# Patient Record
Sex: Female | Born: 1975 | Race: White | Hispanic: No | Marital: Married | Smoking: Never smoker
Health system: Southern US, Community
[De-identification: ages and names within clinical notes are randomized; demographics above are authoritative.]

## PROBLEM LIST (undated history)

## (undated) DIAGNOSIS — F32A Depression, unspecified: Secondary | ICD-10-CM

## (undated) DIAGNOSIS — T7840XA Allergy, unspecified, initial encounter: Secondary | ICD-10-CM

## (undated) DIAGNOSIS — F329 Major depressive disorder, single episode, unspecified: Secondary | ICD-10-CM

## (undated) DIAGNOSIS — G4730 Sleep apnea, unspecified: Secondary | ICD-10-CM

## (undated) DIAGNOSIS — J45909 Unspecified asthma, uncomplicated: Secondary | ICD-10-CM

## (undated) HISTORY — DX: Major depressive disorder, single episode, unspecified: F32.9

## (undated) HISTORY — PX: DENTAL SURGERY: SHX609

## (undated) HISTORY — DX: Depression, unspecified: F32.A

## (undated) HISTORY — DX: Sleep apnea, unspecified: G47.30

## (undated) HISTORY — PX: TONSILLECTOMY: SUR1361

## (undated) HISTORY — DX: Allergy, unspecified, initial encounter: T78.40XA

## (undated) HISTORY — DX: Unspecified asthma, uncomplicated: J45.909

---

## 2000-10-24 HISTORY — PX: BRAIN TUMOR EXCISION: SHX577

## 2005-10-24 HISTORY — PX: ABDOMINAL HYSTERECTOMY: SHX81

## 2016-06-30 ENCOUNTER — Ambulatory Visit (INDEPENDENT_AMBULATORY_CARE_PROVIDER_SITE_OTHER): Payer: BLUE CROSS/BLUE SHIELD | Admitting: Family Medicine

## 2016-06-30 ENCOUNTER — Encounter: Payer: Self-pay | Admitting: Family Medicine

## 2016-06-30 DIAGNOSIS — Z23 Encounter for immunization: Secondary | ICD-10-CM

## 2016-06-30 DIAGNOSIS — Z7189 Other specified counseling: Secondary | ICD-10-CM

## 2016-06-30 DIAGNOSIS — Z7689 Persons encountering health services in other specified circumstances: Secondary | ICD-10-CM

## 2016-06-30 DIAGNOSIS — F640 Transsexualism: Secondary | ICD-10-CM | POA: Diagnosis not present

## 2016-06-30 DIAGNOSIS — D332 Benign neoplasm of brain, unspecified: Secondary | ICD-10-CM | POA: Insufficient documentation

## 2016-06-30 DIAGNOSIS — D330 Benign neoplasm of brain, supratentorial: Secondary | ICD-10-CM | POA: Diagnosis not present

## 2016-06-30 NOTE — Patient Instructions (Addendum)
Need old records from [facility] in [place] to eat well and stay active  Will refill medication once I have the records  Need bloodwork after resuming the medicine  Will get endocrinologist referral

## 2016-06-30 NOTE — Progress Notes (Addendum)
Chief Complaint  Patient presents with  . Annual Exam    est care    40 year old female.   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  Only medicine is testosterone. No ongoing medical problems Old records requested.    Patient Active Problem List   Diagnosis Date Noted  . Encounter to establish care with new doctor 06/30/2016  . 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   No facility-administered encounter medications on file as of 06/30/2016.     Past Medical History:  Diagnosis Date  . Allergy    hay fever  . Asthma    as a child  . Depression    old history  . Sleep apnea    not on cpap    Past Surgical History:  Procedure Laterality Date  . ABDOMINAL HYSTERECTOMY  2007  . BRAIN TUMOR EXCISION  2002   benign - frontal  . TONSILLECTOMY      Social History   Social History  . Marital status: Married    Spouse name: N/A  . Number of children: N/A  . Years of education: 57   Occupational History  . attorney    Social History Main Topics  . Smoking status: Never Smoker  . Smokeless tobacco: Never Used  . Alcohol use Yes     Comment: occasionally  . Drug use: No  . Sexual activity: Yes   Other Topics Concern  . Not on file   Social History Narrative   Born female - trans female    Family History  Problem Relation Age of Onset  . Glaucoma Mother   . Diabetes Mother     prediabetes  . Heart disease Maternal Grandfather     heart attack young  . Heart disease Paternal Grandmother     Review of Systems  Constitutional: Negative for chills, fever and weight loss.  HENT: Negative for congestion and hearing loss.   Eyes: Negative for blurred vision and pain.       Glasses  Respiratory: Negative for cough and shortness of breath.   Cardiovascular: Negative for chest pain and leg swelling.  Gastrointestinal: Negative for abdominal pain, constipation,  diarrhea and heartburn.  Genitourinary: Negative for dysuria and frequency.  Musculoskeletal: Negative for falls, joint pain and myalgias.       Arthritis one knee  Neurological: Negative for dizziness, seizures and headaches.  Endo/Heme/Allergies:       MILD LACTOSE INTOLERANCE  Psychiatric/Behavioral: Negative for depression. The patient is not nervous/anxious and does not have insomnia.     BP 98/62   Pulse 68   Resp 18   Ht 5\' 6"  (1.676 m)   Wt 224 lb (101.6 kg)   SpO2 98%   BMI 36.15 kg/m   Physical Exam  Constitutional: He is oriented to person, place, and time. He appears well-developed and well-nourished.  HENT:  Head: Normocephalic and atraumatic.  Mouth/Throat: Oropharynx is clear and moist.  Eyes: Conjunctivae are normal. Pupils are equal, round, and reactive to light.  Neurological: He is alert and oriented to person, place, and time.  Skin: Skin is warm and dry.  Psychiatric: He has a normal mood and affect. His behavior is normal. Judgment and thought content normal.  Nursing note and vitals reviewed.   1. Encounter to establish care with new doctor   2. [facility] Will get old records and refill testosterone.  Will  refer Endocrinology  3. history benign brain tumor Needs surveillance MRI   Patient Instructions  Need old records from [facility] in [place] to eat well and stay active  Will refill medication once I have the records  Need bloodwork after resuming the medicine  Will get endocrinologist referral        Raylene Everts, MD

## 2016-07-08 ENCOUNTER — Telehealth: Payer: Self-pay | Admitting: Family Medicine

## 2016-07-08 NOTE — Telephone Encounter (Signed)
Need to know where to send medical record request

## 2016-09-21 ENCOUNTER — Encounter: Payer: Self-pay | Admitting: Family Medicine

## 2016-09-27 ENCOUNTER — Ambulatory Visit: Payer: BLUE CROSS/BLUE SHIELD | Admitting: Family Medicine

## 2017-01-09 ENCOUNTER — Encounter: Payer: Self-pay | Admitting: Family Medicine

## 2017-01-10 ENCOUNTER — Other Ambulatory Visit: Payer: Self-pay | Admitting: Family Medicine

## 2017-01-10 DIAGNOSIS — Z789 Other specified health status: Secondary | ICD-10-CM

## 2017-01-10 DIAGNOSIS — F640 Transsexualism: Secondary | ICD-10-CM

## 2017-08-27 ENCOUNTER — Emergency Department (HOSPITAL_COMMUNITY)
Admission: EM | Admit: 2017-08-27 | Discharge: 2017-08-27 | Disposition: A | Discharge status: Home / Self Care | Payer: BLUE CROSS/BLUE SHIELD | Attending: Emergency Medicine | Admitting: Emergency Medicine

## 2017-08-27 ENCOUNTER — Encounter (HOSPITAL_COMMUNITY): Payer: Self-pay | Admitting: Emergency Medicine

## 2017-08-27 ENCOUNTER — Emergency Department (HOSPITAL_COMMUNITY): Payer: BLUE CROSS/BLUE SHIELD

## 2017-08-27 DIAGNOSIS — M25551 Pain in right hip: Secondary | ICD-10-CM | POA: Insufficient documentation

## 2017-08-27 NOTE — Discharge Instructions (Signed)
Your x-ray did not show a reason for why your hip is hurting.  Please use the crutches as needed.  Apply ice several times a day.  Take naproxen twice a day.  You may take acetaminophen along with that.  Continue taking your low-dose aspirin daily.  If it is not improving over the next several days, follow-up with the orthopedic physician.

## 2017-08-27 NOTE — ED Triage Notes (Signed)
RT hip pain since fall last night
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
## 2018-01-01 ENCOUNTER — Encounter: Payer: Self-pay | Admitting: Family Medicine

## 2018-08-20 DIAGNOSIS — S99822A Other specified injuries of left foot, initial encounter: Secondary | ICD-10-CM | POA: Diagnosis not present

## 2018-08-20 DIAGNOSIS — S91332A Puncture wound without foreign body, left foot, initial encounter: Secondary | ICD-10-CM | POA: Diagnosis not present

## 2019-04-13 DIAGNOSIS — M25551 Pain in right hip: Secondary | ICD-10-CM | POA: Diagnosis not present

## 2019-04-13 DIAGNOSIS — S76011A Strain of muscle, fascia and tendon of right hip, initial encounter: Secondary | ICD-10-CM | POA: Diagnosis not present

## 2019-08-07 ENCOUNTER — Other Ambulatory Visit: Payer: Self-pay

## 2019-08-07 DIAGNOSIS — Z20822 Contact with and (suspected) exposure to covid-19: Secondary | ICD-10-CM

## 2019-08-09 LAB — NOVEL CORONAVIRUS, NAA: SARS-CoV-2, NAA: NOT DETECTED

## 2019-09-25 DIAGNOSIS — K056 Periodontal disease, unspecified: Secondary | ICD-10-CM | POA: Diagnosis not present

## 2019-09-25 DIAGNOSIS — K069 Disorder of gingiva and edentulous alveolar ridge, unspecified: Secondary | ICD-10-CM | POA: Diagnosis not present

## 2019-10-03 DIAGNOSIS — K029 Dental caries, unspecified: Secondary | ICD-10-CM | POA: Diagnosis not present

## 2019-10-03 DIAGNOSIS — K056 Periodontal disease, unspecified: Secondary | ICD-10-CM | POA: Diagnosis not present

## 2019-10-03 DIAGNOSIS — K069 Disorder of gingiva and edentulous alveolar ridge, unspecified: Secondary | ICD-10-CM | POA: Diagnosis not present

## 2019-10-10 DIAGNOSIS — R110 Nausea: Secondary | ICD-10-CM | POA: Diagnosis not present

## 2019-10-10 DIAGNOSIS — J029 Acute pharyngitis, unspecified: Secondary | ICD-10-CM | POA: Diagnosis not present

## 2019-10-10 DIAGNOSIS — H9203 Otalgia, bilateral: Secondary | ICD-10-CM | POA: Diagnosis not present

## 2019-10-10 DIAGNOSIS — H6983 Other specified disorders of Eustachian tube, bilateral: Secondary | ICD-10-CM | POA: Diagnosis not present

## 2019-10-11 DIAGNOSIS — J029 Acute pharyngitis, unspecified: Secondary | ICD-10-CM | POA: Diagnosis not present

## 2019-11-11 ENCOUNTER — Other Ambulatory Visit: Payer: Self-pay

## 2019-11-11 ENCOUNTER — Ambulatory Visit
Admission: EM | Admit: 2019-11-11 | Discharge: 2019-11-11 | Disposition: A | Discharge status: Home / Self Care | Payer: BC Managed Care – PPO | Attending: Emergency Medicine | Admitting: Emergency Medicine

## 2019-11-11 DIAGNOSIS — Z90710 Acquired absence of both cervix and uterus: Secondary | ICD-10-CM | POA: Diagnosis not present

## 2019-11-11 DIAGNOSIS — N898 Other specified noninflammatory disorders of vagina: Secondary | ICD-10-CM | POA: Insufficient documentation

## 2019-11-11 DIAGNOSIS — Z880 Allergy status to penicillin: Secondary | ICD-10-CM | POA: Insufficient documentation

## 2019-11-11 DIAGNOSIS — L290 Pruritus ani: Secondary | ICD-10-CM | POA: Insufficient documentation

## 2019-11-11 DIAGNOSIS — Z90722 Acquired absence of ovaries, bilateral: Secondary | ICD-10-CM | POA: Diagnosis not present

## 2019-11-11 DIAGNOSIS — Z885 Allergy status to narcotic agent status: Secondary | ICD-10-CM | POA: Insufficient documentation

## 2019-11-11 MED ORDER — FLUCONAZOLE 200 MG PO TABS: ORAL_TABLET | ORAL | 0 refills | Status: DC

## 2019-11-11 NOTE — Discharge Instructions (Addendum)
Vaginal self-swab obtained.  We will follow up with you regarding abnormal results Declines HIV/ syphilis testing today Based on symptoms we will treat for possible yeast Prescribed diflucan 200 mg once daily and then second dose 72 hours later Take medications as prescribed and to completion If tests results are positive, please abstain from sexual activity until you and your partner(s) have been treated Follow up with PCP if symptoms persists Return here or go to ER if you have any new or worsening symptoms fever, chills, nausea, vomiting, abdominal or pelvic pain, painful intercourse, vaginal discharge, vaginal bleeding, persistent symptoms despite treatment, etc..Marland Kitchen

## 2019-11-11 NOTE — ED Provider Notes (Signed)
[facility]   [id number] 11/11/19 Arrival Time: [id number]   [id number] itching  SUBJECTIVE:  Abigail Hoffman is a 44 y.o. adult who presents with complaints of vaginal and anal itching and irritation 3 weeks ago.  Reports recent antibiotic use as well as a few episodes of loose stools. Denies changes in detergents soaps, body washes, or baths.  Patient is sexually active with 1 female partner.  Partner without symptoms.  She has tried OTC antifungal and vagisil without relief.  Denies aggravating factors.  She denies similar symptoms in the past.  She denies fever, chills, nausea, vomiting, abdominal or pelvic pain, urinary symptoms, vaginal discharge, vaginal odor, vaginal bleeding, dyspareunia, vaginal rashes or lesions.   Patient is in the process of converting from female to female.  Had a complete hysterectomy; including ovaries, uterus and cervix removed.  Was taking testosterone, currently not taking any testosterone.    No LMP recorded. Patient has had a hysterectomy.  ROS: As per HPI.  All other pertinent ROS negative.     Past Medical History:  Diagnosis Date  . Allergy    hay fever  . Asthma    as a child  . Depression    old history  . Sleep apnea    not on cpap   Past Surgical History:  Procedure Laterality Date  . ABDOMINAL HYSTERECTOMY  2007  . BRAIN TUMOR EXCISION  2002   benign - frontal  . DENTAL SURGERY    . TONSILLECTOMY     Allergies  Allergen Reactions  . Azithromycin Diarrhea  . Morphine And Related Other (See Comments)    Nightmares   . Penicillins Diarrhea   No current facility-administered medications on file prior to encounter.   No current outpatient medications on file prior to encounter.    Social History   Socioeconomic History  . Marital status: Married    Spouse name: Not on file  . Number of children: Not on file  . Years of education: 60  . Highest education level: Not on file  Occupational History  . Occupation: attorney   Tobacco Use  . Smoking status: Never Smoker  . Smokeless tobacco: Never Used  Substance and Sexual Activity  . Alcohol use: Yes    Comment: occasionally  . Drug use: No  . Sexual activity: Yes  Other Topics Concern  . 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 Difficulty of Paying Living Expenses: Not on file  Food Insecurity:   . Worried About Charity fundraiser in the Last Year: Not on file  . Ran Out of Food in the Last Year: Not on file  Transportation Needs:   . Lack of Transportation (Medical): Not on file  . Lack of Transportation (Non-Medical): Not on file  Physical Activity:   . Days of Exercise per Week: Not on file  . Minutes of Exercise per Session: Not on file  Stress:   . Feeling of Stress : Not on file  Social Connections:   . Frequency of Communication with Friends and Family: Not on file  . Frequency of Social Gatherings with Friends and Family: Not on file  . Attends Religious Services: Not on file  . Active Member of Clubs or Organizations: Not on file  . Attends Archivist Meetings: Not on file  . Marital Status: Not on file  Intimate Partner  Violence:   . Fear of Current or Ex-Partner: Not on file  . Emotionally Abused: Not on file  . Physically Abused: Not on file  . Sexually Abused: Not on file   Family History  Problem Relation Age of Onset  . Glaucoma Mother   . Diabetes Mother        prediabetes  . Heart disease Maternal Grandfather        heart attack young  . Heart disease Paternal Grandmother     OBJECTIVE:  Vitals:   11/11/19 1226  BP: 124/86  Pulse: 78  Resp: 16  Temp: 98.2 F (36.8 C)  TempSrc: Oral  SpO2: 98%     General appearance: Alert, NAD, appears stated age Head: NCAT Throat: lips, mucosa, and tongue normal; teeth and gums normal Lungs: CTA bilaterally without adventitious breath sounds Heart: regular rate and  rhythm.  Back: no CVA tenderness Abdomen: soft, non-tender; bowel sounds normal; no guarding GU: Declines; agrees to vaginal swab Skin: warm and dry Psychological:  Alert and cooperative. Normal mood and affect.  LABS:  Results for orders placed or performed in visit on 08/07/19  Novel Coronavirus, NAA ([facility])   Specimen: Nasopharyngeal(NP) swabs in vial transport medium   NASOPHARYNGE  TESTING  Result Value Ref Range   SARS-CoV-2, NAA Not Detected Not Detected    Labs Reviewed  CERVICOVAGINAL ANCILLARY ONLY    ASSESSMENT & PLAN:  1. Vaginal itching   2. Vaginal irritation   3. Anal itching     Meds ordered this encounter  Medications  . fluconazole (DIFLUCAN) 200 MG tablet    Sig: Take one dose by mouth, wait 72 hours, and then take second dose by mouth    Dispense:  2 tablet    Refill:  0    Order Specific Question:   Supervising Provider    Answer:   Raylene Everts [id number]    Pending: Labs Reviewed  CERVICOVAGINAL ANCILLARY ONLY   Vaginal self-swab obtained.  We will follow up with you regarding abnormal results Declines HIV/ syphilis testing today Based on symptoms we will treat for possible yeast Prescribed diflucan 200 mg once daily and then second dose 72 hours later Take medications as prescribed and to completion If tests results are positive, please abstain from sexual activity until you and your partner(s) have been treated Follow up with PCP if symptoms persists Return here or go to ER if you have any new or worsening symptoms fever, chills, nausea, vomiting, abdominal or pelvic pain, painful intercourse, vaginal discharge, vaginal bleeding, persistent symptoms despite treatment, etc...  Reviewed expectations re: course of current medical issues. Questions answered. Outlined signs and symptoms indicating need for more acute intervention. Patient verbalized understanding. After Visit Summary given.       Lestine Box, PA-C 11/11/19  1737

## 2019-11-11 NOTE — ED Triage Notes (Signed)
Pt presents to UC w/ c/o itchy rash in genital and anal area x3 weeks. Pt has tried vagisil and wet wipes for toileting.

## 2019-11-14 LAB — CERVICOVAGINAL ANCILLARY ONLY
Bacterial vaginitis: NEGATIVE
Candida vaginitis: POSITIVE — AB
Chlamydia: NEGATIVE
Neisseria Gonorrhea: NEGATIVE
Trichomonas: NEGATIVE

## 2019-12-09 ENCOUNTER — Other Ambulatory Visit: Payer: Self-pay

## 2019-12-09 ENCOUNTER — Ambulatory Visit
Admission: EM | Admit: 2019-12-09 | Discharge: 2019-12-09 | Disposition: A | Discharge status: Home / Self Care | Payer: BC Managed Care – PPO | Attending: Emergency Medicine | Admitting: Emergency Medicine

## 2019-12-09 DIAGNOSIS — L299 Pruritus, unspecified: Secondary | ICD-10-CM | POA: Diagnosis not present

## 2019-12-09 DIAGNOSIS — L304 Erythema intertrigo: Secondary | ICD-10-CM

## 2019-12-09 MED ORDER — CLOTRIMAZOLE 1 % EX CREA: TOPICAL_CREAM | CUTANEOUS | 0 refills | Status: DC

## 2019-12-09 NOTE — ED Provider Notes (Signed)
[facility]   [id number] 12/09/19 Arrival Time: 13  CC: Rash  SUBJECTIVE:  Abigail Hoffman is a 44 y.o. adult who presents with a itchy rash x apx 3 weeks to bilateral inner thighs.  Denies precipitating event or trauma.  Denies changes in soaps, detergents, close contacts with similar rash, environmental trigger, or allergy. Denies medications change or starting a new medication recently.  Localizes the rash to bilateral inner thighs.  Describes it as itchy.  Has tried OTC dude wipes/ vagisil wipes with minimal relief.  Symptoms are made worse with scratching.  Reports similar symptoms 3 weeks ago and dx'ed with yeast infect.  States vaginal and anal symptoms have resolved.   Denies fever, chills, nausea, vomiting, erythema, swelling, vaginal discharge, vaginal odor, vaginal itching, anal itching, SOB, chest pain, abdominal pain, changes in bowel or bladder function.    ROS: As per HPI.  All other pertinent ROS negative.     Past Medical History:  Diagnosis Date  . Allergy    hay fever  . Asthma    as a child  . Depression    old history  . Sleep apnea    not on cpap   Past Surgical History:  Procedure Laterality Date  . ABDOMINAL HYSTERECTOMY  2007  . BRAIN TUMOR EXCISION  2002   benign - frontal  . DENTAL SURGERY    . TONSILLECTOMY     Allergies  Allergen Reactions  . Azithromycin Diarrhea  . Morphine And Related Other (See Comments)    Nightmares   . Penicillins Diarrhea   No current facility-administered medications on file prior to encounter.   No current outpatient medications on file prior to encounter.   Social History   Socioeconomic History  . Marital status: Married    Spouse name: Not on file  . Number of children: Not on file  . Years of education: 85  . Highest education level: Not on file  Occupational History  . Occupation: attorney  Tobacco Use  . Smoking status: Never Smoker  . Smokeless tobacco: Never Used  Substance and Sexual  Activity  . Alcohol use: Yes    Comment: occasionally  . Drug use: No  . Sexual activity: Yes  Other Topics Concern  . 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 Difficulty of Paying Living Expenses: Not on file  Food Insecurity:   . Worried About Charity fundraiser in the Last Year: Not on file  . Ran Out of Food in the Last Year: Not on file  Transportation Needs:   . Lack of Transportation (Medical): Not on file  . Lack of Transportation (Non-Medical): Not on file  Physical Activity:   . Days of Exercise per Week: Not on file  . Minutes of Exercise per Session: Not on file  Stress:   . Feeling of Stress : Not on file  Social Connections:   . Frequency of Communication with Friends and Family: Not on file  . Frequency of Social Gatherings with Friends and Family: Not on file  . Attends Religious Services: Not on file  . Active Member of Clubs or Organizations: Not on file  . Attends Archivist Meetings: Not on file  . Marital Status: Not on file  Intimate Partner Violence:   . Fear of Current or Ex-Partner: Not on file  . Emotionally Abused: Not  on file  . Physically Abused: Not on file  . Sexually Abused: Not on file   Family History  Problem Relation Age of Onset  . Glaucoma Mother   . Diabetes Mother        prediabetes  . Heart disease Maternal Grandfather        heart attack young  . Heart disease Paternal Grandmother     OBJECTIVE: Vitals:   12/09/19 1449  BP: 116/78  Pulse: 83  Resp: 16  Temp: 98.7 F (37.1 C)  TempSrc: Oral  SpO2: 97%    General appearance: alert; no distress Head: NCAT Lungs: clear to auscultation bilaterally Heart: regular rate and rhythm.   Abdomen: soft, nondistended, normal active bowel sounds; nontender to palpation; no guarding  Extremities: no edema Skin: warm and dry; mild hyperpigmentation of groin folds, no obvious  pustules, erythema, discharge or bleeding, NTTP Psychological: alert and cooperative; normal mood and affect  ASSESSMENT & PLAN:  1. Itching   2. Pruritic intertrigo     Meds ordered this encounter  Medications  . clotrimazole (LOTRIMIN) 1 % cream    Sig: Apply to affected area 2 times daily    Dispense:  40 g    Refill:  0    Order Specific Question:   Supervising Provider    Answer:   Raylene Everts [id number]    Daily cleansing of skin folds with a mild cleanser followed by drying of affected area with a soft cloth or hair dryer on a cool setting Airing of affected area when feasible Daily application of drying powders Use of absorbent material or clothing, such as cotton or merino wool, to separate skin in folds Clotrimazole prescribed.  Use as instructed Follow up with PCP as needed Return or go to the ER if you have any new or worsening symptoms such as fever, chills, nausea, vomiting, redness, swelling, discharge, if symptoms do not improve with medications, etc...  Reviewed expectations re: course of current medical issues. Questions answered. Outlined signs and symptoms indicating need for more acute intervention. Patient verbalized understanding. After Visit Summary given.   Lestine Box, PA-C 12/09/19 1525

## 2019-12-09 NOTE — Discharge Instructions (Addendum)
Daily cleansing of skin folds with a mild cleanser followed by drying of affected area with a soft cloth or hair dryer on a cool setting Airing of affected area when feasible Daily application of drying powders Use of absorbent material or clothing, such as cotton or merino wool, to separate skin in folds Clotrimazole prescribed.  Use as instructed Follow up with PCP as needed Return or go to the ER if you have any new or worsening symptoms such as fever, chills, nausea, vomiting, redness, swelling, discharge, if symptoms do not improve with medications, etc..Marland Kitchen

## 2019-12-09 NOTE — ED Triage Notes (Signed)
Pt presents to UC w/ c/o itching on thighs in groin area x3 weeks. Pt was seen at this UC 4 weeks ago for itching on genital area. Pt states prescription helped for 1 week, then rash returned but in different area.

## 2020-01-09 ENCOUNTER — Encounter (INDEPENDENT_AMBULATORY_CARE_PROVIDER_SITE_OTHER): Payer: Self-pay | Admitting: Internal Medicine

## 2020-01-09 ENCOUNTER — Other Ambulatory Visit: Payer: Self-pay

## 2020-01-09 ENCOUNTER — Ambulatory Visit (INDEPENDENT_AMBULATORY_CARE_PROVIDER_SITE_OTHER): Payer: BC Managed Care – PPO | Admitting: Internal Medicine

## 2020-01-09 VITALS — BP 120/80 | HR 86 | Temp 97.8°F | Resp 18 | Ht 66.0 in | Wt 203.2 lb

## 2020-01-09 DIAGNOSIS — E2839 Other primary ovarian failure: Secondary | ICD-10-CM | POA: Diagnosis not present

## 2020-01-09 DIAGNOSIS — Z131 Encounter for screening for diabetes mellitus: Secondary | ICD-10-CM

## 2020-01-09 DIAGNOSIS — F640 Transsexualism: Secondary | ICD-10-CM | POA: Diagnosis not present

## 2020-01-09 DIAGNOSIS — Z13220 Encounter for screening for lipoid disorders: Secondary | ICD-10-CM

## 2020-01-09 DIAGNOSIS — E559 Vitamin D deficiency, unspecified: Secondary | ICD-10-CM | POA: Diagnosis not present

## 2020-01-09 DIAGNOSIS — R5381 Other malaise: Secondary | ICD-10-CM

## 2020-01-09 DIAGNOSIS — R5383 Other fatigue: Secondary | ICD-10-CM

## 2020-01-09 NOTE — Progress Notes (Signed)
Metrics: Intervention Frequency ACO  Documented Smoking Status Yearly  Screened one or more times in 24 months  Cessation Counseling or  Active cessation medication Past 24 months  Past 24 months   Guideline developer: UpToDate (See UpToDate for funding source) Date Released: 2014       Wellness Office Visit  Subjective:  Patient ID: Abigail Hoffman, adult    DOB: 1976/02/27  Age: 44 y.o. MRN: [id number]  CC: This 44 year old man comes to our practice as a new patient to be established. HPI  He was born female and in 2007, he underwent surgery to remove his uterus, ovaries.  He was on testosterone AndroGel but over the years has not been taking any.  He feels very fatigued, has no sex drive.  His focus and concentration is not good. He also had a benign brain tumor excision, frontal lobe in 2002. Past Medical History:  Diagnosis Date  . Allergy    hay fever  . Depression    old history  . Sleep apnea    not on cpap      Family History  Problem Relation Age of Onset  . Glaucoma Mother   . Heart disease Maternal Grandfather        heart attack young  . 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   Social History   Tobacco Use  . Smoking status: Current Every Day Smoker    Types: E-cigarettes  . Smokeless tobacco: Never Used  . Tobacco comment: Pt states he Vaps on occasion. Keeps him from bitting nails.  Substance Use Topics  . Alcohol use: Yes    Comment: occasionally    No outpatient medications have been marked as taking for the 01/09/20 encounter (Office Visit) with Doree Albee, MD.       Objective:   Today's Vitals: BP 120/80 (BP Location: Right Arm, Patient Position: Sitting, Cuff Size: Normal)   Pulse 86   Temp 97.8 F (36.6 C) (Temporal)   Resp 18   Ht 5\' 6"  (1.676 m)   Wt 203 lb 3.2 oz (92.2 kg)    SpO2 98%   BMI 32.80 kg/m  Vitals with BMI 01/09/2020 12/09/2019 11/11/2019  Height 5\' 6"  - -  Weight 203 lbs 3 oz - -  BMI [id number] - -  Systolic 123456 [id number]  Diastolic 80 78 86  Pulse 86 83 78     Physical Exam  He is obese.   Blood pressure is well controlled.       Assessment   1. Transexualism   2. Female hypogonadism   3. Malaise and fatigue   4. Vitamin D deficiency disease   5. Diabetes mellitus screening   6. Screening for lipoid disorders       Tests ordered Orders Placed This Encounter  Procedures  . CBC  . COMPLETE METABOLIC PANEL WITH GFR  . Testos,Total,Free and SHBG (Female)  . Hemoglobin A1c  . Lipid panel  . VITAMIN D 25 Hydroxy (Vit-D Deficiency, Fractures)     Plan: 1. Blood work is ordered above. 2. I explained the philosophy of our practice. 3. I will start him on testosterone cream and I have called this to [place] at 5 mg daily applied to the labia. 4. Follow-up in a few weeks to discuss all his results and further recommendations   No  orders of the defined types were placed in this encounter.   Doree Albee, MD

## 2020-01-10 ENCOUNTER — Other Ambulatory Visit (INDEPENDENT_AMBULATORY_CARE_PROVIDER_SITE_OTHER): Payer: Self-pay | Admitting: Internal Medicine

## 2020-01-10 MED ORDER — GLIPIZIDE 5 MG PO TABS: 5.0000 mg | ORAL_TABLET | Freq: Two times a day (BID) | ORAL | 3 refills | Status: DC

## 2020-01-10 NOTE — Progress Notes (Signed)
- 

## 2020-01-13 ENCOUNTER — Encounter (INDEPENDENT_AMBULATORY_CARE_PROVIDER_SITE_OTHER): Payer: Self-pay | Admitting: Internal Medicine

## 2020-01-13 ENCOUNTER — Ambulatory Visit (INDEPENDENT_AMBULATORY_CARE_PROVIDER_SITE_OTHER): Payer: BC Managed Care – PPO | Admitting: Internal Medicine

## 2020-01-13 ENCOUNTER — Other Ambulatory Visit: Payer: Self-pay

## 2020-01-13 VITALS — BP 120/79 | HR 80 | Temp 97.7°F | Ht 66.0 in | Wt 205.8 lb

## 2020-01-13 DIAGNOSIS — E1165 Type 2 diabetes mellitus with hyperglycemia: Secondary | ICD-10-CM

## 2020-01-13 LAB — CBC
HCT: 44.6 % (ref 35.0–45.0)
Hemoglobin: 15.2 g/dL (ref 11.7–15.5)
MCH: 29.4 pg (ref 27.0–33.0)
MCHC: 34.1 g/dL (ref 32.0–36.0)
MCV: 86.3 fL (ref 80.0–100.0)
MPV: 10.9 fL (ref 7.5–12.5)
Platelets: 235 10*3/uL (ref 140–400)
RBC: 5.17 10*6/uL — ABNORMAL HIGH (ref 3.80–5.10)
RDW: 11.9 % (ref 11.0–15.0)
WBC: 6.1 10*3/uL (ref 3.8–10.8)

## 2020-01-13 LAB — HEMOGLOBIN A1C
Hgb A1c MFr Bld: 14 % of total Hgb — ABNORMAL HIGH (ref <5.7)
Mean Plasma Glucose: 355 (calc)
eAG (mmol/L): 19.7 (calc)

## 2020-01-13 LAB — LIPID PANEL
Cholesterol: 195 mg/dL (ref <200)
HDL: 42 mg/dL — ABNORMAL LOW (ref >=50)
LDL Cholesterol (Calc): 106 mg/dL (calc) — ABNORMAL HIGH
Non-HDL Cholesterol (Calc): 153 mg/dL (calc) — ABNORMAL HIGH (ref <130)
Total CHOL/HDL Ratio: 4.6 (calc) (ref <5.0)
Triglycerides: 354 mg/dL — ABNORMAL HIGH (ref <150)

## 2020-01-13 LAB — TESTOS,TOTAL,FREE AND SHBG (FEMALE)
Free Testosterone: 0.6 pg/mL (ref 0.1–6.4)
Sex Hormone Binding: 36 nmol/L (ref 17–124)
Testosterone, Total, LC-MS-MS: 5 ng/dL (ref 2–45)

## 2020-01-13 LAB — COMPLETE METABOLIC PANEL WITH GFR
AG Ratio: 1.9 (calc) (ref 1.0–2.5)
ALT: 17 U/L (ref 6–29)
AST: 15 U/L (ref 10–30)
Albumin: 4.2 g/dL (ref 3.6–5.1)
Alkaline phosphatase (APISO): 101 U/L (ref 31–125)
BUN: 11 mg/dL (ref 7–25)
CO2: 25 mmol/L (ref 20–32)
Calcium: 9.4 mg/dL (ref 8.6–10.2)
Chloride: 100 mmol/L (ref 98–110)
Creat: 0.8 mg/dL (ref 0.50–1.10)
GFR, Est African American: 104 mL/min/{1.73_m2} (ref >=60)
GFR, Est Non African American: 90 mL/min/{1.73_m2} (ref >=60)
Globulin: 2.2 g/dL (calc) (ref 1.9–3.7)
Glucose, Bld: 502 mg/dL (ref 65–99)
Potassium: 4 mmol/L (ref 3.5–5.3)
Sodium: 136 mmol/L (ref 135–146)
Total Bilirubin: 0.6 mg/dL (ref 0.2–1.2)
Total Protein: 6.4 g/dL (ref 6.1–8.1)

## 2020-01-13 LAB — VITAMIN D 25 HYDROXY (VIT D DEFICIENCY, FRACTURES): Vit D, 25-Hydroxy: 9 ng/mL — ABNORMAL LOW (ref 30–100)

## 2020-01-13 MED ORDER — BLOOD GLUCOSE MONITOR KIT: PACK | 0 refills | Status: AC

## 2020-01-13 NOTE — Progress Notes (Signed)
Metrics: Intervention Frequency ACO  Documented Smoking Status Yearly  Screened one or more times in 24 months  Cessation Counseling or  Active cessation medication Past 24 months  Past 24 months   Guideline developer: UpToDate (See UpToDate for funding source) Date Released: 2014       Wellness Office Visit  Subjective:  Patient ID: Abigail Hoffman, adult    DOB: 1975/12/10  Age: 44 y.o. MRN: [id number]  CC: This man comes into further discuss his uncontrolled newly diagnosed diabetes type 2. HPI  He is hemoglobin A1c was 14% with blood glucose above 500.  He does admit that he has been having symptoms of polydipsia, polyuria and weight loss.  He generally feels unwell and tired. Had already started him on glipizide 5 mg twice a day to help get the blood sugar levels better controlled. Past Medical History:  Diagnosis Date  . Allergy    hay fever  . Depression    old history  . Sleep apnea    not on cpap      Family History  Problem Relation Age of Onset  . Glaucoma Mother   . Heart disease Maternal Grandfather        heart attack young  . 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   Social History   Tobacco Use  . Smoking status: Current Every Day Smoker    Types: E-cigarettes  . Smokeless tobacco: Never Used  . Tobacco comment: Pt states he Vaps on occasion. Keeps him from bitting nails.  Substance Use Topics  . Alcohol use: Yes    Comment: occasionally    Current Meds  Medication Sig  . glipiZIDE (GLUCOTROL) 5 MG tablet Take 1 tablet (5 mg total) by mouth 2 (two) times daily before a meal.       Objective:   Today's Vitals: BP 120/79 (BP Location: Right Arm, Patient Position: Sitting, Cuff Size: Normal)   Pulse 80   Temp 97.7 F (36.5 C) (Temporal)   Ht 5' 6"  (1.676 m)   Wt 205 lb 12.8 oz (93.4 kg)    SpO2 99%   BMI 33.22 kg/m  Vitals with BMI 01/13/2020 01/09/2020 12/09/2019  Height 5' 6"  5' 6"  -  Weight 205 lbs 13 oz 203 lbs 3 oz -  BMI 44.03 47.42 -  Systolic 595 638 756  Diastolic 79 80 78  Pulse 80 86 83     Physical Exam  He looks systemically well.  No other new physical findings today.     Assessment   1. Uncontrolled type 2 diabetes mellitus with hyperglycemia ([place])       Tests ordered No orders of the defined types were placed in this encounter.    Plan: 1. I discussed the pathophysiology of diabetes and we discussed the concept of intermittent fasting combined with a plant-based diet to help with his diabetes.  We discussed nutrition at length. 2. For the time being he will continue with glipizide twice a day and I told him he can only have the medication when he does eat.  I have encouraged him to make sure he drinks plenty of water every day at least 100 ounces. 3. I will see him at the next scheduled appointment and we will see how he is doing then. 4. I spent 45 minutes with this patient discussing nutrition, diabetes  above.   Meds ordered this encounter  Medications  . blood glucose meter kit and supplies KIT    Sig: Dispense based on patient and insurance preference. Use up to four times daily as directed. (FOR ICD-9 250.00, 250.01).    Dispense:  1 each    Refill:  0    Please provide glucometer also    Order Specific Question:   Number of strips    Answer:   100    Order Specific Question:   Number of lancets    Answer:   100    Khristian Phillippi Luther Parody, MD

## 2020-01-13 NOTE — Patient Instructions (Signed)
[facility] Dietary Recommendations for Weight Loss What to Avoid . Avoid added sugars o Often added sugar can be found in processed foods such as many condiments, dry cereals, cakes, cookies, chips, crisps, crackers, candies, sweetened drinks, etc.  o Read labels and AVOID/DECREASE use of foods with the following in their ingredient list: Sugar, fructose, high fructose corn syrup, sucrose, glucose, maltose, dextrose, molasses, cane sugar, brown sugar, any type of syrup, agave nectar, etc.   . Avoid snacking in between meals . Avoid foods made with flour o If you are going to eat food made with flour, choose those made with whole-grains; and, minimize your consumption as much as is tolerable . Avoid processed foods o These foods are generally stocked in the middle of the grocery store. Focus on shopping on the perimeter of the grocery.  . Avoid Meat  o We recommend following a plant-based diet at [facility]. Thus, we recommend avoiding meat as a general rule. Consider eating beans, legumes, eggs, and/or dairy products for regular protein sources o If you plan on eating meat limit to 4 ounces of meat at a time and choose lean options such as Fish, chicken, turkey. Avoid red meat intake such as pork and/or steak What to Include . Vegetables o GREEN LEAFY VEGETABLES: Kale, spinach, mustard greens, collard greens, cabbage, broccoli, etc. o OTHER: Asparagus, cauliflower, eggplant, carrots, peas, Brussel sprouts, tomatoes, bell peppers, zucchini, beets, cucumbers, etc. . Grains, seeds, and legumes o Beans: kidney beans, black eyed peas, garbanzo beans, black beans, pinto beans, etc. o Whole, unrefined grains: brown rice, barley, bulgur, oatmeal, etc. . Healthy fats  o Avoid highly processed fats such as vegetable oil o Examples of healthy fats: avocado, olives, virgin olive oil, dark chocolate (?72% Cocoa), nuts (peanuts, almonds, walnuts, cashews, pecans, etc.) . None to Low  Intake of Animal Sources of Protein o Meat sources: chicken, turkey, salmon, tuna. Limit to 4 ounces of meat at one time. o Consider limiting dairy sources, but when choosing dairy focus on: PLAIN Greek yogurt, cottage cheese, high-protein milk . Fruit o Choose berries  When to Eat . Intermittent Fasting: o Choosing not to eat for a specific time period, but DO FOCUS ON HYDRATION when fasting o Multiple Techniques: - Time Restricted Eating: eat 3 meals in a day, each meal lasting no more than 60 minutes, no snacks between meals - 16-18 hour fast: fast for 16 to 18 hours up to 7 days a week. Often suggested to start with 2-3 nonconsecutive days per week.  . Remember the time you sleep is counted as fasting.  . Examples of eating schedule: Fast from 7:00pm-11:00am. Eat between 11:00am-7:00pm.  - 24-hour fast: fast for 24 hours up to every other day. Often suggested to start with 1 day per week . Remember the time you sleep is counted as fasting . Examples of eating schedule:  o Eating day: eat 2-3 meals on your eating day. If doing 2 meals, each meal should last no more than 90 minutes. If doing 3 meals, each meal should last no more than 60 minutes. Finish last meal by 7:00pm. o Fasting day: Fast until 7:00pm.  o IF YOU FEEL UNWELL FOR ANY REASON/IN ANY WAY WHEN FASTING, STOP FASTING BY EATING A NUTRITIOUS SNACK OR LIGHT MEAL o ALWAYS FOCUS ON HYDRATION DURING FASTS - Acceptable Hydration sources: water, broths, tea/coffee (black tea/coffee is best but using a small amount of whole-fat dairy products in coffee/tea is acceptable).  -   Poor Hydration Sources: anything with sugar or artificial sweeteners added to it  These recommendations have been developed for patients that are actively receiving medical care from either Dr. Arrayah Connors or Sarah Gray, DNP, NP-C at [facility]. These recommendations are developed for patients with specific medical conditions and are not meant to be  distributed or used by others that are not actively receiving care from either provider listed above at [facility]. It is not appropriate to participate in the above eating plans without proper medical supervision.   Reference: Fung, J. The obesity code. [place]: [place]; 2016.   

## 2020-01-16 ENCOUNTER — Ambulatory Visit (INDEPENDENT_AMBULATORY_CARE_PROVIDER_SITE_OTHER): Payer: BC Managed Care – PPO | Admitting: Internal Medicine

## 2020-02-20 ENCOUNTER — Ambulatory Visit (INDEPENDENT_AMBULATORY_CARE_PROVIDER_SITE_OTHER): Payer: BC Managed Care – PPO | Admitting: Internal Medicine

## 2020-02-20 ENCOUNTER — Other Ambulatory Visit: Payer: Self-pay

## 2020-02-20 ENCOUNTER — Encounter (INDEPENDENT_AMBULATORY_CARE_PROVIDER_SITE_OTHER): Payer: Self-pay | Admitting: Internal Medicine

## 2020-02-20 VITALS — BP 104/64 | HR 68 | Resp 15 | Ht 66.0 in | Wt 204.4 lb

## 2020-02-20 DIAGNOSIS — Z6833 Body mass index (BMI) 33.0-33.9, adult: Secondary | ICD-10-CM | POA: Diagnosis not present

## 2020-02-20 DIAGNOSIS — E1165 Type 2 diabetes mellitus with hyperglycemia: Secondary | ICD-10-CM

## 2020-02-20 DIAGNOSIS — E6609 Other obesity due to excess calories: Secondary | ICD-10-CM | POA: Diagnosis not present

## 2020-02-20 MED ORDER — METFORMIN HCL ER 500 MG PO TB24: 500.0000 mg | ORAL_TABLET | Freq: Every day | ORAL | 3 refills | Status: DC

## 2020-02-20 NOTE — Progress Notes (Signed)
Metrics: Intervention Frequency ACO  Documented Smoking Status Yearly  Screened one or more times in 24 months  Cessation Counseling or  Active cessation medication Past 24 months  Past 24 months   Guideline developer: UpToDate (See UpToDate for funding source) Date Released: 2014       Wellness Office Visit  Subjective:  Patient ID: Abigail Hoffman, adult    DOB: 04-05-1976  Age: 44 y.o. MRN: [id number]  CC: This man comes in for follow-up of uncontrolled diabetes. HPI  He has been on glipizide twice a day and he has been trying to follow a healthier diet.  Unfortunately, he has had difficulties with intermittent fasting and that most that he has been able to do his 12 hours.  On 1 day on the weekend, he was able to do 16 hours.  Overall, his blood sugar levels have improved. Past Medical History:  Diagnosis Date  . Allergy    hay fever  . Depression    old history  . Sleep apnea    not on cpap      Family History  Problem Relation Age of Onset  . Glaucoma Mother   . Heart disease Maternal Grandfather        heart attack young  . 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   Social History   Tobacco Use  . Smoking status: Current Every Day Smoker    Types: E-cigarettes  . Smokeless tobacco: Never Used  . Tobacco comment: Pt states he Vaps on occasion. Keeps him from bitting nails.  Substance Use Topics  . Alcohol use: Yes    Comment: occasionally    Current Meds  Medication Sig  . blood glucose meter kit and supplies KIT Dispense based on patient and insurance preference. Use up to four times daily as directed. (FOR ICD-9 250.00, 250.01).  Marland Kitchen glipiZIDE (GLUCOTROL) 5 MG tablet Take 1 tablet (5 mg total) by mouth 2 (two) times daily before a meal.      Objective:   Today's Vitals: BP 104/64   Pulse 68   Resp 15    Ht 5' 6"  (1.676 m)   Wt 204 lb 6.4 oz (92.7 kg)   SpO2 97%   BMI 32.99 kg/m  Vitals with BMI 02/20/2020 01/13/2020 01/09/2020  Height 5' 6"  5' 6"  5' 6"   Weight 204 lbs 6 oz 205 lbs 13 oz 203 lbs 3 oz  BMI 33.01 41.66 06.30  Systolic 160 109 323  Diastolic 64 79 80  Pulse 68 80 86     Physical Exam  He looks systemically well but he has not lost significant weight.  He remains obese.  Blood pressure is good.     Assessment   1. Uncontrolled type 2 diabetes mellitus with hyperglycemia ([facility])   2. Class 1 obesity due to excess calories without serious comorbidity with body mass index (BMI) of 33.0 to 33.9 in adult       Tests ordered No orders of the defined types were placed in this encounter.    Plan: 1. At this point in time, I am going to try to switch him over to Metformin which will help with insulin resistance.  I want him to wean off and discontinue glipizide and I discussed in detail possible side effects of Metformin and how to deal with them.  I have given  him the extended release Metformin which I think is better tolerated. 2. I also encouraged him to try to do intermittent fasting and gave him strategies to do this including black coffee, apple cider vinegar in his water and drinking plenty of water when he feels hungry. 3. Follow-up in 2 months time and we will do the blood work at that point.   Meds ordered this encounter  Medications  . metFORMIN (GLUCOPHAGE-XR) 500 MG 24 hr tablet    Sig: Take 1 tablet (500 mg total) by mouth daily with breakfast.    Dispense:  60 tablet    Refill:  3    Boleslaus Holloway Luther Parody, MD

## 2020-04-15 ENCOUNTER — Ambulatory Visit (INDEPENDENT_AMBULATORY_CARE_PROVIDER_SITE_OTHER): Payer: BC Managed Care – PPO | Admitting: Internal Medicine

## 2020-04-15 ENCOUNTER — Other Ambulatory Visit: Payer: Self-pay

## 2020-04-15 ENCOUNTER — Encounter (INDEPENDENT_AMBULATORY_CARE_PROVIDER_SITE_OTHER): Payer: Self-pay | Admitting: Internal Medicine

## 2020-04-15 VITALS — BP 120/90 | HR 75 | Temp 97.5°F | Ht 66.0 in | Wt 200.6 lb

## 2020-04-15 DIAGNOSIS — E1165 Type 2 diabetes mellitus with hyperglycemia: Secondary | ICD-10-CM

## 2020-04-15 DIAGNOSIS — E6609 Other obesity due to excess calories: Secondary | ICD-10-CM | POA: Diagnosis not present

## 2020-04-15 DIAGNOSIS — F640 Transsexualism: Secondary | ICD-10-CM | POA: Diagnosis not present

## 2020-04-15 DIAGNOSIS — Z6833 Body mass index (BMI) 33.0-33.9, adult: Secondary | ICD-10-CM

## 2020-04-15 MED ORDER — METFORMIN HCL ER 500 MG PO TB24: 500.0000 mg | ORAL_TABLET | Freq: Two times a day (BID) | ORAL | 0 refills | Status: AC

## 2020-04-15 MED ORDER — FIRST-TESTOSTERONE MC 2 % TD CREA: 5.0000 mg | TOPICAL_CREAM | Freq: Every day | TRANSDERMAL | 0 refills | Status: AC

## 2020-04-15 NOTE — Progress Notes (Signed)
Metrics: Intervention Frequency ACO  Documented Smoking Status Yearly  Screened one or more times in 24 months  Cessation Counseling or  Active cessation medication Past 24 months  Past 24 months   Guideline developer: UpToDate (See UpToDate for funding source) Date Released: 2014       Wellness Office Visit  Subjective:  Patient ID: Abigail Hoffman, adult    DOB: 26-May-1976  Age: 44 y.o. MRN: [id number]  CC: This man comes in for follow-up of diabetes. HPI  He has been doing better with nutrition.  He has tolerated the Metformin once a day.  He is now doing intermittent fasting on almost every day 18 hours and is trying to eat healthier.  He has noticed his blood glucose levels are improving. He also needs testosterone cream which he has been on. Past Medical History:  Diagnosis Date  . Allergy    hay fever  . Depression    old history  . Sleep apnea    not on cpap   Past Surgical History:  Procedure Laterality Date  . ABDOMINAL HYSTERECTOMY  2007  . BRAIN TUMOR EXCISION  2002   benign - frontal  . DENTAL SURGERY    . TONSILLECTOMY       Family History  Problem Relation Age of Onset  . Glaucoma Mother   . Heart disease Maternal Grandfather        heart attack young  . 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   Social History   Tobacco Use  . Smoking status: Current Every Day Smoker    Types: E-cigarettes  . Smokeless tobacco: Never Used  . Tobacco comment: Pt states he Vaps on occasion. Keeps him from bitting nails.  Substance Use Topics  . Alcohol use: Yes    Comment: occasionally    Current Meds  Medication Sig  . blood glucose meter kit and supplies KIT Dispense based on patient and insurance preference. Use up to four times daily as directed. (FOR ICD-9 250.00, 250.01).  . metFORMIN (GLUCOPHAGE-XR) 500 MG  24 hr tablet Take 1 tablet (500 mg total) by mouth 2 (two) times daily.  . Testosterone Propionate (FIRST-TESTOSTERONE MC) 2 % CREA Place 5 mg onto the skin daily.  . [DISCONTINUED] metFORMIN (GLUCOPHAGE-XR) 500 MG 24 hr tablet Take 1 tablet (500 mg total) by mouth daily with breakfast.      Depression screen [facility] 2/9 02/20/2020  Decreased Interest 0  Down, Depressed, Hopeless 0  PHQ - 2 Score 0     Objective:   Today's Vitals: BP 120/90 (BP Location: Left Arm, Patient Position: Sitting, Cuff Size: Normal)   Pulse 75   Temp (!) 97.5 F (36.4 C) (Temporal)   Ht 5' 6"  (1.676 m)   Wt 200 lb 9.6 oz (91 kg)   SpO2 97%   BMI 32.38 kg/m  Vitals with BMI 04/15/2020 02/20/2020 01/13/2020  Height 5' 6"  5' 6"  5' 6"   Weight 200 lbs 10 oz 204 lbs 6 oz 205 lbs 13 oz  BMI 32.39 40.98 11.91  Systolic 478 295 621  Diastolic 90 64 79  Pulse 75 68 80     Physical Exam   He has lost 4 pounds since last visit and his clothes appear to be baggy.    Assessment   1. Uncontrolled type 2 diabetes mellitus with hyperglycemia ([place])  2. Class 1 obesity due to excess calories without serious comorbidity with body mass index (BMI) of 33.0 to 33.9 in adult   3. Transexualism       Tests ordered Orders Placed This Encounter  Procedures  . Hemoglobin A1c  . COMPLETE METABOLIC PANEL WITH GFR     Plan: 1. Blood work is ordered for his A1c for his diabetes.  I have told him to increase the Metformin to twice a day and given a new prescription for this.  He needs a hemoglobin A1c less than 8% before he can undergo orthodontic surgery. 2. I will call in testosterone cream to [facility] 5 mg apply to the labia daily. 3. Follow-up in 3 months.   Meds ordered this encounter  Medications  . metFORMIN (GLUCOPHAGE-XR) 500 MG 24 hr tablet    Sig: Take 1 tablet (500 mg total) by mouth 2 (two) times daily.    Dispense:  180 tablet    Refill:  0  . Testosterone Propionate  (FIRST-TESTOSTERONE MC) 2 % CREA    Sig: Place 5 mg onto the skin daily.    Dispense:  30 g    Refill:  0    Stevey Stapleton Luther Parody, MD

## 2020-04-16 LAB — COMPLETE METABOLIC PANEL WITH GFR
AG Ratio: 2.1 (calc) (ref 1.0–2.5)
ALT: 15 U/L (ref 6–29)
AST: 13 U/L (ref 10–35)
Albumin: 4.8 g/dL (ref 3.6–5.1)
Alkaline phosphatase (APISO): 72 U/L (ref 31–125)
BUN/Creatinine Ratio: 18 (calc) (ref 6–22)
BUN: 20 mg/dL (ref 7–25)
CO2: 26 mmol/L (ref 20–32)
Calcium: 10.5 mg/dL — ABNORMAL HIGH (ref 8.6–10.2)
Chloride: 104 mmol/L (ref 98–110)
Creat: 1.13 mg/dL — ABNORMAL HIGH (ref 0.50–1.10)
GFR, Est African American: 68 mL/min/{1.73_m2} (ref >=60)
GFR, Est Non African American: 59 mL/min/{1.73_m2} — ABNORMAL LOW (ref >=60)
Globulin: 2.3 g/dL (calc) (ref 1.9–3.7)
Glucose, Bld: 109 mg/dL — ABNORMAL HIGH (ref 65–99)
Potassium: 5.5 mmol/L — ABNORMAL HIGH (ref 3.5–5.3)
Sodium: 142 mmol/L (ref 135–146)
Total Bilirubin: 0.8 mg/dL (ref 0.2–1.2)
Total Protein: 7.1 g/dL (ref 6.1–8.1)

## 2020-04-16 LAB — HEMOGLOBIN A1C
Hgb A1c MFr Bld: 5.8 % of total Hgb — ABNORMAL HIGH (ref <5.7)
Mean Plasma Glucose: 120 (calc)
eAG (mmol/L): 6.6 (calc)

## 2020-06-18 DIAGNOSIS — K068 Other specified disorders of gingiva and edentulous alveolar ridge: Secondary | ICD-10-CM | POA: Diagnosis not present

## 2020-06-18 DIAGNOSIS — K0820 Unspecified atrophy of edentulous alveolar ridge: Secondary | ICD-10-CM | POA: Diagnosis not present

## 2020-06-18 DIAGNOSIS — K056 Periodontal disease, unspecified: Secondary | ICD-10-CM | POA: Diagnosis not present

## 2020-07-20 ENCOUNTER — Ambulatory Visit (INDEPENDENT_AMBULATORY_CARE_PROVIDER_SITE_OTHER): Payer: BC Managed Care – PPO | Admitting: Internal Medicine

## 2020-08-05 ENCOUNTER — Ambulatory Visit (INDEPENDENT_AMBULATORY_CARE_PROVIDER_SITE_OTHER): Payer: BC Managed Care – PPO | Admitting: Internal Medicine

## 2020-08-05 ENCOUNTER — Other Ambulatory Visit: Payer: Self-pay

## 2020-08-05 ENCOUNTER — Encounter (INDEPENDENT_AMBULATORY_CARE_PROVIDER_SITE_OTHER): Payer: Self-pay | Admitting: Internal Medicine

## 2020-08-05 VITALS — BP 128/70 | HR 57 | Temp 96.9°F | Resp 18 | Ht 66.0 in | Wt 186.0 lb

## 2020-08-05 DIAGNOSIS — Z13820 Encounter for screening for osteoporosis: Secondary | ICD-10-CM

## 2020-08-05 DIAGNOSIS — E669 Obesity, unspecified: Secondary | ICD-10-CM

## 2020-08-05 DIAGNOSIS — E2839 Other primary ovarian failure: Secondary | ICD-10-CM

## 2020-08-05 DIAGNOSIS — E1165 Type 2 diabetes mellitus with hyperglycemia: Secondary | ICD-10-CM | POA: Diagnosis not present

## 2020-08-05 DIAGNOSIS — F640 Transsexualism: Secondary | ICD-10-CM | POA: Diagnosis not present

## 2020-08-05 NOTE — Progress Notes (Signed)
Metrics: Intervention Frequency ACO  Documented Smoking Status Yearly  Screened one or more times in 24 months  Cessation Counseling or  Active cessation medication Past 24 months  Past 24 months   Guideline developer: UpToDate (See UpToDate for funding source) Date Released: 2014       Wellness Office Visit  Subjective:  Patient ID: Abigail Hoffman, adult    DOB: 1976/03/16  Age: 44 y.o. MRN: [id number]  CC: This man comes in for follow-up of diabetes and testosterone therapy due to transgender/transsexual state. HPI  The patient reminded me that he had a total hysterectomy with removal of both ovaries approximately 15 years ago.  Denies any specific hot flashes although possibly. He has done very well with nutrition and has managed to lose further weight.  He is actually now seriously training for cycling/running event. He continues with Metformin. He continues with testosterone cream. Past Medical History:  Diagnosis Date  . Allergy    hay fever  . Depression    old history  . Sleep apnea    not on cpap   Past Surgical History:  Procedure Laterality Date  . ABDOMINAL HYSTERECTOMY  2007  . BRAIN TUMOR EXCISION  2002   benign - frontal  . DENTAL SURGERY    . TONSILLECTOMY       Family History  Problem Relation Age of Onset  . Glaucoma Mother   . Heart disease Maternal Grandfather        heart attack young  . 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   Social History   Tobacco Use  . Smoking status: Current Every Day Smoker    Types: E-cigarettes  . Smokeless tobacco: Never Used  . Tobacco comment: Pt states he Vaps on occasion. Keeps him from bitting nails.  Substance Use Topics  . Alcohol use: Yes    Comment: occasionally    Current Meds  Medication Sig  . blood glucose meter kit and supplies KIT Dispense  based on patient and insurance preference. Use up to four times daily as directed. (FOR ICD-9 250.00, 250.01).  . metFORMIN (GLUCOPHAGE-XR) 500 MG 24 hr tablet Take 1 tablet (500 mg total) by mouth 2 (two) times daily.  . Testosterone Propionate (FIRST-TESTOSTERONE MC) 2 % CREA Place 5 mg onto the skin daily.  . Testosterone Propionate (FIRST-TESTOSTERONE MC) 2 % CREA Place 5 mg onto the skin daily.      Depression screen [facility] 2/9 02/20/2020  Decreased Interest 0  Down, Depressed, Hopeless 0  PHQ - 2 Score 0     Objective:   Today's Vitals: BP 128/70 (BP Location: Right Arm, Patient Position: Sitting, Cuff Size: Normal)   Pulse (!) 57   Temp (!) 96.9 F (36.1 C) (Temporal)   Resp 18   Ht 5' 6"  (1.676 m)   Wt 186 lb (84.4 kg)   SpO2 96%   BMI 30.02 kg/m  Vitals with BMI 08/05/2020 04/15/2020 02/20/2020  Height 5' 6"  5' 6"  5' 6"   Weight 186 lbs 200 lbs 10 oz 204 lbs 6 oz  BMI 30.04 94.70 96.28  Systolic 366 294 765  Diastolic 70 90 64  Pulse 57 75 68     Physical Exam   He looks systemically well and has lost 14 pounds since the last visit.  Blood pressure is in a very good range.  Assessment   1. Uncontrolled type 2 diabetes mellitus with hyperglycemia ([place])   2. Transexualism   3. Obesity (BMI 30-39.9)   4. Female hypogonadism   5. Osteoporosis screening       Tests ordered Orders Placed This Encounter  Procedures  . DG Bone Density  . COMPLETE METABOLIC PANEL WITH GFR  . Testos,Total,Free and SHBG (Female)  . Hemoglobin A1c  . Estradiol  . Progesterone     Plan: 1. I am very happy with the control of diabetes.  We will check an A1c. 2. Since he was born female and has had surgical menopause induced, we will also today check estradiol and progesterone levels and also screen for osteoporosis with DEXA scan. 3. Influenza vaccination was given today. 4. I did recommend that he start taking vitamin D3 10,000 units daily since his vitamin D levels were  extremely low at only 9.  I may have mentioned this before but the patient is not taking any vitamin D3 supplementation at the present time. 5. Follow-up in about 3 to 4 months time and further recommendations will depend on the results.   No orders of the defined types were placed in this encounter.   Doree Albee, MD

## 2020-08-08 LAB — COMPLETE METABOLIC PANEL WITH GFR
AG Ratio: 2.1 (calc) (ref 1.0–2.5)
ALT: 16 U/L (ref 6–29)
AST: 14 U/L (ref 10–35)
Albumin: 4.6 g/dL (ref 3.6–5.1)
Alkaline phosphatase (APISO): 81 U/L (ref 31–125)
BUN: 25 mg/dL (ref 7–25)
CO2: 25 mmol/L (ref 20–32)
Calcium: 10 mg/dL (ref 8.6–10.2)
Chloride: 106 mmol/L (ref 98–110)
Creat: 0.99 mg/dL (ref 0.50–1.10)
GFR, Est African American: 80 mL/min/{1.73_m2} (ref >=60)
GFR, Est Non African American: 69 mL/min/{1.73_m2} (ref >=60)
Globulin: 2.2 g/dL (calc) (ref 1.9–3.7)
Glucose, Bld: 89 mg/dL (ref 65–99)
Potassium: 4.3 mmol/L (ref 3.5–5.3)
Sodium: 142 mmol/L (ref 135–146)
Total Bilirubin: 0.6 mg/dL (ref 0.2–1.2)
Total Protein: 6.8 g/dL (ref 6.1–8.1)

## 2020-08-08 LAB — HEMOGLOBIN A1C
Hgb A1c MFr Bld: 5.7 % of total Hgb — ABNORMAL HIGH (ref <5.7)
Mean Plasma Glucose: 117 (calc)
eAG (mmol/L): 6.5 (calc)

## 2020-08-08 LAB — TESTOS,TOTAL,FREE AND SHBG (FEMALE)
Free Testosterone: 24.3 pg/mL — ABNORMAL HIGH (ref 0.1–6.4)
Sex Hormone Binding: 82 nmol/L (ref 17–124)
Testosterone, Total, LC-MS-MS: 293 ng/dL — ABNORMAL HIGH (ref 2–45)

## 2020-08-08 LAB — PROGESTERONE: Progesterone: <0.5 ng/mL

## 2020-08-08 LAB — ESTRADIOL: Estradiol: 19 pg/mL

## 2020-09-02 DIAGNOSIS — S73191A Other sprain of right hip, initial encounter: Secondary | ICD-10-CM | POA: Diagnosis not present

## 2020-09-25 DIAGNOSIS — S73191D Other sprain of right hip, subsequent encounter: Secondary | ICD-10-CM | POA: Diagnosis not present

## 2020-10-09 DIAGNOSIS — M25451 Effusion, right hip: Secondary | ICD-10-CM | POA: Diagnosis not present

## 2020-10-09 DIAGNOSIS — M25551 Pain in right hip: Secondary | ICD-10-CM | POA: Diagnosis not present

## 2020-10-19 DIAGNOSIS — S73191D Other sprain of right hip, subsequent encounter: Secondary | ICD-10-CM | POA: Diagnosis not present

## 2020-10-26 ENCOUNTER — Ambulatory Visit
Admission: EM | Admit: 2020-10-26 | Discharge: 2020-10-26 | Disposition: A | Discharge status: Home / Self Care | Payer: BC Managed Care – PPO | Attending: Family Medicine | Admitting: Family Medicine

## 2020-10-26 DIAGNOSIS — Z1152 Encounter for screening for COVID-19: Secondary | ICD-10-CM

## 2020-10-26 NOTE — ED Triage Notes (Signed)
Needs covid test

## 2020-10-29 LAB — NOVEL CORONAVIRUS, NAA: SARS-CoV-2, NAA: DETECTED — AB

## 2020-12-13 ENCOUNTER — Other Ambulatory Visit: Payer: Self-pay

## 2020-12-13 ENCOUNTER — Ambulatory Visit
Admission: EM | Admit: 2020-12-13 | Discharge: 2020-12-13 | Disposition: A | Discharge status: Home / Self Care | Payer: BC Managed Care – PPO | Attending: Family Medicine | Admitting: Family Medicine

## 2020-12-13 ENCOUNTER — Encounter: Payer: Self-pay | Admitting: Emergency Medicine

## 2020-12-13 DIAGNOSIS — H66002 Acute suppurative otitis media without spontaneous rupture of ear drum, left ear: Secondary | ICD-10-CM | POA: Diagnosis not present

## 2020-12-13 DIAGNOSIS — H9202 Otalgia, left ear: Secondary | ICD-10-CM | POA: Diagnosis not present

## 2020-12-13 MED ORDER — CEFDINIR 300 MG PO CAPS: 300.0000 mg | ORAL_CAPSULE | Freq: Two times a day (BID) | ORAL | 0 refills | Status: AC

## 2020-12-13 NOTE — Discharge Instructions (Signed)
I have sent in Cefdinir for you to take twice a day for 7 days  Follow up with this office or with primary care if symptoms are persisting.  Follow up in the ER for high fever, trouble swallowing, trouble breathing, other concerning symptoms.

## 2020-12-13 NOTE — ED Provider Notes (Signed)
[place]   [id number] 12/13/20 Arrival Time: 7824  CC: EAR PAIN  SUBJECTIVE: History from: patient.  Abigail Hoffman is a 45 y.o. adult who presents with of bilateral otalgia with the left worse than the right. Denies a precipitating event, such as swimming or wearing ear plugs. Patient states the pain is constant and achy in character. Patient has taken OTC medications for this without relief. Denies similar symptoms in the past. Denies fever, chills, fatigue, sinus pain, rhinorrhea, ear discharge, sore throat, SOB, wheezing, chest pain, nausea, changes in bowel or bladder habits.    ROS: As per HPI.  All other pertinent ROS negative.     Past Medical History:  Diagnosis Date  . Allergy    hay fever  . Depression    old history  . Sleep apnea    not on cpap   Past Surgical History:  Procedure Laterality Date  . ABDOMINAL HYSTERECTOMY  2007  . BRAIN TUMOR EXCISION  2002   benign - frontal  . DENTAL SURGERY    . TONSILLECTOMY     Allergies  Allergen Reactions  . Erythromycin   . Azithromycin Diarrhea  . Morphine Nausea And Vomiting    Nightmares   . Morphine And Related Other (See Comments)    Nightmares   . Penicillins Diarrhea   No current facility-administered medications on file prior to encounter.   Current Outpatient Medications on File Prior to Encounter  Medication Sig Dispense Refill  . blood glucose meter kit and supplies KIT Dispense based on patient and insurance preference. Use up to four times daily as directed. (FOR ICD-9 250.00, 250.01). 1 each 0  . metFORMIN (GLUCOPHAGE-XR) 500 MG 24 hr tablet Take 1 tablet (500 mg total) by mouth 2 (two) times daily. 180 tablet 0  . Testosterone Propionate (FIRST-TESTOSTERONE MC) 2 % CREA Place 5 mg onto the skin daily.    . Testosterone Propionate (FIRST-TESTOSTERONE MC) 2 % CREA Place 5 mg onto the skin daily. 30 g 0   Social History   Socioeconomic History  . Marital status: Married    Spouse name:  Not on file  . Number of children: Not on file  . Years of education: 36  . Highest education level: Not on file  Occupational History  . Occupation: attorney  Tobacco Use  . Smoking status: Current Every Day Smoker    Types: E-cigarettes  . Smokeless tobacco: Never Used  . Tobacco comment: Pt states he Vaps on occasion. Keeps him from bitting nails.  Substance and Sexual Activity  . Alcohol use: Yes    Comment: occasionally  . Drug use: No  . Sexual activity: Yes  Other Topics Concern  . 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   Social Determinants of Health   Financial Resource Strain: Not on file  Food Insecurity: Not on file  Transportation Needs: Not on file  Physical Activity: Not on file  Stress: Not on file  Social Connections: Not on file  Intimate Partner Violence: Not on file   Family History  Problem Relation Age of Onset  . Glaucoma Mother   . Heart disease Maternal Grandfather        heart attack young  . Heart disease Paternal Grandmother     OBJECTIVE:  Vitals:   12/13/20 1027  BP: 111/76  Pulse: 65  Resp: 18  Temp: 98.8 F (37.1 C)  TempSrc: Oral  SpO2: 98%     General appearance: alert; appears fatigued HEENT: Ears: EACs clear, bilateral TMs erythematous, bulging, with effusion; Eyes: PERRL, EOMI grossly; Sinuses nontender to palpation; Nose: clear rhinorrhea; Throat: oropharynx mildly erythematous, tonsils 1+ without white tonsillar exudates, uvula midline Neck: supple without LAD Lungs: unlabored respirations, symmetrical air entry; cough: absent; no respiratory distress Heart: regular rate and rhythm.  Radial pulses 2+ symmetrical bilaterally Skin: warm and dry Psychological: alert and cooperative; normal mood and affect  Imaging: No results found.   ASSESSMENT & PLAN:  1. Non-recurrent acute suppurative otitis media of left  ear without spontaneous rupture of tympanic membrane   2. Acute otalgia, left     Meds ordered this encounter  Medications  . cefdinir (OMNICEF) 300 MG capsule    Sig: Take 1 capsule (300 mg total) by mouth 2 (two) times daily for 7 days.    Dispense:  14 capsule    Refill:  0    Order Specific Question:   Supervising Provider    Answer:   Chase Picket [[id number]]    Rest and drink plenty of fluids Prescribed Cefdinir BID x 7 days Take medications as directed and to completion Continue to use OTC ibuprofen and/ or tylenol as needed for pain control Follow up with PCP if symptoms persists Return here or go to the ER if you have any new or worsening symptoms   Reviewed expectations re: course of current medical issues. Questions answered. Outlined signs and symptoms indicating need for more acute intervention. Patient verbalized understanding. After Visit Summary given.         Faustino Congress, NP 12/13/20 1512

## 2020-12-13 NOTE — ED Triage Notes (Signed)
Left ear pain for the past couple of days.  Hard to hear out of that ear.

## 2020-12-14 ENCOUNTER — Other Ambulatory Visit: Payer: Self-pay

## 2020-12-14 ENCOUNTER — Encounter (INDEPENDENT_AMBULATORY_CARE_PROVIDER_SITE_OTHER): Payer: Self-pay | Admitting: Internal Medicine

## 2020-12-14 ENCOUNTER — Ambulatory Visit (INDEPENDENT_AMBULATORY_CARE_PROVIDER_SITE_OTHER): Payer: BC Managed Care – PPO | Admitting: Internal Medicine

## 2020-12-14 VITALS — BP 124/76 | HR 71 | Temp 96.8°F | Ht 66.0 in | Wt 189.0 lb

## 2020-12-14 DIAGNOSIS — E2839 Other primary ovarian failure: Secondary | ICD-10-CM

## 2020-12-14 DIAGNOSIS — F640 Transsexualism: Secondary | ICD-10-CM

## 2020-12-14 DIAGNOSIS — E559 Vitamin D deficiency, unspecified: Secondary | ICD-10-CM

## 2020-12-14 DIAGNOSIS — E669 Obesity, unspecified: Secondary | ICD-10-CM | POA: Diagnosis not present

## 2020-12-14 DIAGNOSIS — E1165 Type 2 diabetes mellitus with hyperglycemia: Secondary | ICD-10-CM | POA: Diagnosis not present

## 2020-12-14 MED ORDER — PROGESTERONE 200 MG PO CAPS: 200.0000 mg | ORAL_CAPSULE | Freq: Every day | ORAL | 3 refills | Status: AC

## 2020-12-14 NOTE — Progress Notes (Signed)
Metrics: Intervention Frequency ACO  Documented Smoking Status Yearly  Screened one or more times in 24 months  Cessation Counseling or  Active cessation medication Past 24 months  Past 24 months   Guideline developer: UpToDate (See UpToDate for funding source) Date Released: 2014       Wellness Office Visit  Subjective:  Patient ID: Abigail Hoffman, adult    DOB: 25-Apr-1976  Age: 45 y.o. MRN: [id number]  CC: This man comes in for follow-up of diabetes, transsexualism, obesity, vitamin D deficiency. HPI  Since last time I saw him, he has not been as consistent with nutrition and his blood glucose levels have been elevated.  He also has been trying to cut down the dose of Metformin to 250 mg daily which might somewhat account for the elevation in blood sugar levels.  His last hemoglobin A1c was only 5.7%. We did blood work on the last visit which showed healthy testosterone levels but as expected nonexistent progesterone and very low estradiol levels as he has had both his ovaries removed. Past Medical History:  Diagnosis Date  . Allergy    hay fever  . Depression    old history  . Sleep apnea    not on cpap   Past Surgical History:  Procedure Laterality Date  . ABDOMINAL HYSTERECTOMY  2007  . BRAIN TUMOR EXCISION  2002   benign - frontal  . DENTAL SURGERY    . TONSILLECTOMY       Family History  Problem Relation Age of Onset  . Glaucoma Mother   . Heart disease Maternal Grandfather        heart attack young  . 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   Social History   Tobacco Use  . Smoking status: Current Every Day Smoker    Types: E-cigarettes  . Smokeless tobacco: Never Used  . Tobacco comment: Pt states he Vaps on occasion. Keeps him from bitting nails.  Substance Use Topics  . Alcohol use: Yes    Comment:  occasionally    Current Meds  Medication Sig  . blood glucose meter kit and supplies KIT Dispense based on patient and insurance preference. Use up to four times daily as directed. (FOR ICD-9 250.00, 250.01).  . cefdinir (OMNICEF) 300 MG capsule Take 1 capsule (300 mg total) by mouth 2 (two) times daily for 7 days.  . metFORMIN (GLUCOPHAGE-XR) 500 MG 24 hr tablet Take 1 tablet (500 mg total) by mouth 2 (two) times daily. (Patient taking differently: Take 250 mg by mouth daily.)  . progesterone (PROMETRIUM) 200 MG capsule Take 1 capsule (200 mg total) by mouth daily.  . Testosterone Propionate (FIRST-TESTOSTERONE MC) 2 % CREA Place 5 mg onto the skin daily.  . Testosterone Propionate (FIRST-TESTOSTERONE MC) 2 % CREA Place 5 mg onto the skin daily.     [place] Office Visit from 12/14/2020 in [facility]  PHQ-9 Total Score 0      Objective:   Today's Vitals: BP 124/76   Pulse 71   Temp (!) 96.8 F (36 C) (Temporal)   Ht 5' 6"  (1.676 m)   Wt 189 lb (85.7 kg)   SpO2 99%   BMI 30.51 kg/m  Vitals with BMI 12/14/2020 12/13/2020 10/26/2020  Height 5' 6"  - -  Weight 189 lbs - -  BMI 30.52 - -  Systolic 938 182 993  Diastolic 76 76 76  Pulse 71 65 86     Physical Exam   He looks systemically well, remains obese.  He has gained about 3 pounds since last time I saw him.  Blood pressure is excellent.    Assessment   1. Uncontrolled type 2 diabetes mellitus with hyperglycemia ([place])   2. Transexualism   3. Obesity (BMI 30-39.9)   4. Female hypogonadism   5. Vitamin D deficiency disease       Tests ordered No orders of the defined types were placed in this encounter.    Plan: 1. He will continue with Metformin 250 mg daily and start working on nutrition and also he will exercise now on a regular basis. 2. We discussed identical hormone therapy and after discussion and shared decision making, he is willing to start progesterone which will be protective  against breast cancer.  In the future, he plans to have bilateral mastectomy to complete his transformation.  We will hold off on estradiol as he was concerned that he did not want to go "backwards" when he is finally getting hair growth from testosterone therapy. 3. I will see him in about 6 weeks time to see how he is doing and we will check all the blood work then including his A1c.   Meds ordered this encounter  Medications  . progesterone (PROMETRIUM) 200 MG capsule    Sig: Take 1 capsule (200 mg total) by mouth daily.    Dispense:  30 capsule    Refill:  3    Nimish Luther Parody, MD

## 2021-01-21 ENCOUNTER — Encounter (INDEPENDENT_AMBULATORY_CARE_PROVIDER_SITE_OTHER): Payer: Self-pay | Admitting: Internal Medicine

## 2021-01-21 ENCOUNTER — Ambulatory Visit (INDEPENDENT_AMBULATORY_CARE_PROVIDER_SITE_OTHER): Payer: BC Managed Care – PPO | Admitting: Internal Medicine

## 2021-01-21 ENCOUNTER — Other Ambulatory Visit: Payer: Self-pay

## 2021-01-21 VITALS — BP 120/72 | HR 71 | Temp 97.4°F | Ht 66.0 in | Wt 186.8 lb

## 2021-01-21 DIAGNOSIS — E1165 Type 2 diabetes mellitus with hyperglycemia: Secondary | ICD-10-CM

## 2021-01-21 DIAGNOSIS — E2839 Other primary ovarian failure: Secondary | ICD-10-CM | POA: Diagnosis not present

## 2021-01-21 MED ORDER — TESTOSTERONE 1.62 % TD GEL: 5.0000 mg | Freq: Every day | TRANSDERMAL | 1 refills | Status: DC

## 2021-01-21 NOTE — Progress Notes (Signed)
Metrics: Intervention Frequency ACO  Documented Smoking Status Yearly  Screened one or more times in 24 months  Cessation Counseling or  Active cessation medication Past 24 months  Past 24 months   Guideline developer: UpToDate (See UpToDate for funding source) Date Released: 2014       Wellness Office Visit  Subjective:  Patient ID: Abigail Hoffman, adult    DOB: [birth year]  Age: 45 y.o. MRN: [id number]  CC: This man comes in for follow-up of diabetes, hormone therapy. HPI He has tolerated progesterone although it does make him feel groggy in the morning.  His diabetes seems to be okay based on his numbers and he continues with Metformin. He also continues on testosterone cream and he achieved good levels on this last time that they were checked.  He needs a refill of the testosterone.  Past Medical History:  Diagnosis Date  . Allergy    hay fever  . Depression    old history  . Sleep apnea    not on cpap   Past Surgical History:  Procedure Laterality Date  . ABDOMINAL HYSTERECTOMY  2007  . BRAIN TUMOR EXCISION  2002   benign - frontal  . DENTAL SURGERY    . TONSILLECTOMY       Family History  Problem Relation Age of Onset  . Glaucoma Mother   . Heart disease Maternal Grandfather        heart attack young  . 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   Social History   Tobacco Use  . Smoking status: Current Every Day Smoker    Types: E-cigarettes  . Smokeless tobacco: Never Used  . Tobacco comment: Pt states he Vaps on occasion. Keeps him from bitting nails.  Substance Use Topics  . Alcohol use: Yes    Comment: occasionally    Current Meds  Medication Sig  . blood glucose meter kit and supplies KIT Dispense based on patient and insurance preference. Use up to four times daily as directed. (FOR ICD-9 250.00,  250.01).  . metFORMIN (GLUCOPHAGE-XR) 500 MG 24 hr tablet Take 1 tablet (500 mg total) by mouth 2 (two) times daily. (Patient taking differently: Take 250 mg by mouth daily.)  . progesterone (PROMETRIUM) 200 MG capsule Take 1 capsule (200 mg total) by mouth daily.  . Testosterone 1.62 % GEL Place 5 mg onto the skin daily.  . Testosterone Propionate (FIRST-TESTOSTERONE MC) 2 % CREA Place 5 mg onto the skin daily.     [place] Office Visit from 12/14/2020 in [facility]  PHQ-9 Total Score 0      Objective:   Today's Vitals: BP 120/72   Pulse 71   Temp (!) 97.4 F (36.3 C) (Temporal)   Ht 5' 6"  (1.676 m)   Wt 186 lb 12.8 oz (84.7 kg)   SpO2 98%   BMI 30.15 kg/m  Vitals with BMI 01/21/2021 12/14/2020 12/13/2020  Height 5' 6"  5' 6"  -  Weight 186 lbs 13 oz 189 lbs -  BMI 10.17 51.02 -  Systolic 585 277 824  Diastolic 72 76 76  Pulse 71 71 65     Physical Exam  He looks systemically well, has lost 3 pounds since the last visit.  Blood pressure in a good range.     Assessment   1. Female hypogonadism   2.  Uncontrolled type 2 diabetes mellitus with hyperglycemia ([place])       Tests ordered Orders Placed This Encounter  Procedures  . COMPLETE METABOLIC PANEL WITH GFR  . Hemoglobin A1c  . Progesterone     Plan: 1. Continue with testosterone and progesterone and we will check progesterone level today. 2. Continue with Metformin and we will check renal function and hemoglobin A1c. 3. Follow-up in about 3 months for an annual physical exam.   Meds ordered this encounter  Medications  . Testosterone 1.62 % GEL    Sig: Place 5 mg onto the skin daily.    Dispense:  30 g    Refill:  1    Abigail Ziegler Luther Parody, MD

## 2021-01-22 ENCOUNTER — Other Ambulatory Visit (HOSPITAL_COMMUNITY): Payer: BC Managed Care – PPO

## 2021-01-22 LAB — COMPLETE METABOLIC PANEL WITH GFR
AG Ratio: 2 (calc) (ref 1.0–2.5)
ALT: 16 U/L (ref 6–29)
AST: 21 U/L (ref 10–35)
Albumin: 4.6 g/dL (ref 3.6–5.1)
Alkaline phosphatase (APISO): 63 U/L (ref 31–125)
BUN/Creatinine Ratio: 17 (calc) (ref 6–22)
BUN: 19 mg/dL (ref 7–25)
CO2: 25 mmol/L (ref 20–32)
Calcium: 10 mg/dL (ref 8.6–10.2)
Chloride: 105 mmol/L (ref 98–110)
Creat: 1.12 mg/dL — ABNORMAL HIGH (ref 0.50–1.10)
GFR, Est African American: 69 mL/min/{1.73_m2} (ref >=60)
GFR, Est Non African American: 59 mL/min/{1.73_m2} — ABNORMAL LOW (ref >=60)
Globulin: 2.3 g/dL (calc) (ref 1.9–3.7)
Glucose, Bld: 105 mg/dL — ABNORMAL HIGH (ref 65–99)
Potassium: 5 mmol/L (ref 3.5–5.3)
Sodium: 139 mmol/L (ref 135–146)
Total Bilirubin: 0.9 mg/dL (ref 0.2–1.2)
Total Protein: 6.9 g/dL (ref 6.1–8.1)

## 2021-01-22 LAB — HEMOGLOBIN A1C
Hgb A1c MFr Bld: 5.5 % of total Hgb (ref <5.7)
Mean Plasma Glucose: 111 mg/dL
eAG (mmol/L): 6.2 mmol/L

## 2021-01-22 LAB — PROGESTERONE: Progesterone: 0.7 ng/mL

## 2021-01-25 ENCOUNTER — Encounter (INDEPENDENT_AMBULATORY_CARE_PROVIDER_SITE_OTHER): Payer: Self-pay | Admitting: Internal Medicine

## 2021-02-01 ENCOUNTER — Ambulatory Visit (HOSPITAL_COMMUNITY)
Admission: RE | Admit: 2021-02-01 | Discharge: 2021-02-01 | Disposition: A | Discharge status: Home / Self Care | Payer: BC Managed Care – PPO | Source: Ambulatory Visit | Attending: Internal Medicine | Admitting: Internal Medicine

## 2021-02-01 DIAGNOSIS — E2839 Other primary ovarian failure: Secondary | ICD-10-CM | POA: Insufficient documentation

## 2021-02-01 DIAGNOSIS — Z13820 Encounter for screening for osteoporosis: Secondary | ICD-10-CM | POA: Insufficient documentation

## 2021-02-01 DIAGNOSIS — Z780 Asymptomatic menopausal state: Secondary | ICD-10-CM | POA: Diagnosis not present

## 2021-02-01 DIAGNOSIS — M85852 Other specified disorders of bone density and structure, left thigh: Secondary | ICD-10-CM | POA: Insufficient documentation

## 2021-02-25 ENCOUNTER — Encounter (INDEPENDENT_AMBULATORY_CARE_PROVIDER_SITE_OTHER): Payer: Self-pay | Admitting: Internal Medicine

## 2021-02-25 DIAGNOSIS — I10 Essential (primary) hypertension: Secondary | ICD-10-CM

## 2021-02-25 DIAGNOSIS — E1165 Type 2 diabetes mellitus with hyperglycemia: Secondary | ICD-10-CM

## 2021-02-25 DIAGNOSIS — E6609 Other obesity due to excess calories: Secondary | ICD-10-CM

## 2021-02-25 DIAGNOSIS — Z6833 Body mass index (BMI) 33.0-33.9, adult: Secondary | ICD-10-CM

## 2021-03-04 ENCOUNTER — Other Ambulatory Visit (INDEPENDENT_AMBULATORY_CARE_PROVIDER_SITE_OTHER): Payer: Self-pay | Admitting: Internal Medicine

## 2021-03-04 DIAGNOSIS — E6609 Other obesity due to excess calories: Secondary | ICD-10-CM

## 2021-05-04 ENCOUNTER — Ambulatory Visit (INDEPENDENT_AMBULATORY_CARE_PROVIDER_SITE_OTHER): Payer: BC Managed Care – PPO | Admitting: Internal Medicine

## 2021-05-04 ENCOUNTER — Encounter (INDEPENDENT_AMBULATORY_CARE_PROVIDER_SITE_OTHER): Payer: Self-pay | Admitting: Internal Medicine

## 2021-05-04 ENCOUNTER — Other Ambulatory Visit: Payer: Self-pay

## 2021-05-04 VITALS — BP 128/70 | HR 75 | Temp 97.7°F | Resp 18 | Ht 66.0 in | Wt 190.0 lb

## 2021-05-04 DIAGNOSIS — E2839 Other primary ovarian failure: Secondary | ICD-10-CM | POA: Diagnosis not present

## 2021-05-04 DIAGNOSIS — F640 Transsexualism: Secondary | ICD-10-CM

## 2021-05-04 DIAGNOSIS — Z1159 Encounter for screening for other viral diseases: Secondary | ICD-10-CM | POA: Diagnosis not present

## 2021-05-04 DIAGNOSIS — R5383 Other fatigue: Secondary | ICD-10-CM | POA: Diagnosis not present

## 2021-05-04 DIAGNOSIS — R5381 Other malaise: Secondary | ICD-10-CM

## 2021-05-04 DIAGNOSIS — Z1231 Encounter for screening mammogram for malignant neoplasm of breast: Secondary | ICD-10-CM

## 2021-05-04 DIAGNOSIS — E559 Vitamin D deficiency, unspecified: Secondary | ICD-10-CM

## 2021-05-04 DIAGNOSIS — Z0001 Encounter for general adult medical examination with abnormal findings: Secondary | ICD-10-CM

## 2021-05-04 DIAGNOSIS — E1165 Type 2 diabetes mellitus with hyperglycemia: Secondary | ICD-10-CM | POA: Diagnosis not present

## 2021-05-04 NOTE — Progress Notes (Signed)
Chief Complaint: This 45 year old man comes in for an annual physical exam and also to address his chronic conditions which are described below. HPI: This patient was born female and has had surgery and hormone treatment for purposes of changing to female.  The patient has had a total hysterectomy, including removal of ovaries but has not had a mastectomy.  He still has both breasts intact.  He has not had a mammogram. He continues on progesterone and testosterone.  We had discussed estradiol before but he was not quite keen to proceed with this at the present.  He has not taken any progesterone as he ran out of his medications. He is also diabetic but on his last visit, his hemoglobin A1c was 5.5% and we decided to stop metformin and he continues to do well with nutrition and is now exercising on a regular basis.  In fact, he is due to do his first duothalon.  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    Social History   Tobacco Use   Smoking status: Never   Smokeless tobacco: Never   Tobacco comments:    Pt states he Vaps on occasion. Keeps him from bitting nails.  Substance Use Topics   Alcohol use: Yes    Comment: occasionally      Allergies:  Allergies  Allergen Reactions   Erythromycin    Penicillin G Other (See Comments)   Azithromycin Diarrhea and Other (See Comments)   Morphine Nausea And Vomiting and Other (See Comments)    Nightmares    Morphine And Related Other (See Comments)    Nightmares    Penicillins Diarrhea   Testosterone Rash     Current  Meds  Medication Sig   blood glucose meter kit and supplies KIT Dispense based on patient and insurance preference. Use up to four times daily as directed. (FOR ICD-9 250.00, 250.01).   progesterone (PROMETRIUM) 200 MG capsule Take 1 capsule (200 mg total) by mouth daily.   Testosterone Propionate (FIRST-TESTOSTERONE MC) 2 % CREA Place 5 mg onto the skin daily.   [DISCONTINUED] Testosterone Propionate (FIRST-TESTOSTERONE MC) 2 % CREA Place 5 mg onto the skin daily.     [place] Office Visit from 12/14/2020 in [facility]  PHQ-9 Total Score 0       XFG:HWEXH from the symptoms mentioned above,there are no other symptoms referable to all systems reviewed.       Physical Exam:CMA Chaperone present Blood pressure 128/70, pulse 75, temperature 97.7 F (36.5 Abigail), temperature source Temporal, resp. rate 18, height $RemoveBe'5\' 6"'TDypwzwzd$  (1.676 m), weight 190 lb (86.2 kg), SpO2 98 %. Vitals with BMI 05/04/2021 01/21/2021 12/14/2020  Height $Remov'5\' 6"'AwpykQ$  $Remove'5\' 6"'jnWUjgp$  $RemoveB'5\' 6"'YgviOnHD$   Weight 190 lbs 186 lbs 13 oz 189 lbs  BMI 30.68 37.16 96.78  Systolic 938 101 751  Diastolic 70 72 76  Pulse 75 71 71      He looks systemically well.  His weight is stable.  Blood pressure is excellent. General: Alert, cooperative, and appears to be the stated age.No pallor.  No jaundice.  No clubbing. Head: Normocephalic Eyes: Sclera white, pupils equal and reactive to light, red reflex x 2,  Ears: Normal bilaterally Oral cavity: Lips, mucosa, and tongue normal: Teeth and gums normal Neck: No adenopathy, supple, symmetrical, trachea midline, and thyroid does not appear enlarged Respiratory: Clear to auscultation bilaterally.No wheezing, crackles or bronchial breathing. Cardiovascular: Heart sounds are present and appear to be normal without murmurs or added sounds.  No carotid bruits.  Peripheral pulses are present and equal bilaterally.: Gastrointestinal:positive bowel sounds, no hepatosplenomegaly.  No masses felt.No  tenderness. Skin: Clear, No rashes noted.No worrisome skin lesions seen. Neurological: Grossly intact without focal findings, cranial nerves II through XII intact, muscle strength equal bilaterally Musculoskeletal: No acute joint abnormalities noted.Full range of movement noted with joints. Psychiatric: Affect appropriate, non-anxious.    Assessment  1. Encounter for general adult medical examination with abnormal findings   2. Transexualism   3. Female hypogonadism   4. Vitamin D deficiency disease   5. Encounter for screening mammogram for malignant neoplasm of breast   6. Uncontrolled type 2 diabetes mellitus with hyperglycemia ([place])   7. Malaise and fatigue   8. Menopause ovarian failure   9. Encounter for hepatitis Abigail screening test for low risk patient     Tests Ordered:   Orders Placed This Encounter  Procedures   MM 3D SCREEN BREAST BILATERAL   CBC   COMPLETE METABOLIC PANEL WITH GFR   Hemoglobin A1c   Lipid panel   T3, free   T4, free   TSH   VITAMIN D 25 Hydroxy (Vit-D Deficiency, Fractures)   Testos,Total,Free and SHBG (Female)   Estradiol   Follicle stimulating hormone   Luteinizing hormone   Hepatitis Abigail antibody      Plan  1.Relatively healthy 45 year old. 2.  Born female, converting to female.  He still has breast.  Therefore, I will order a screening mammogram.  He is agreeable to this.  Check hormones today.  Continue with testosterone and progesterone as before. 3.  Continue with diet control for his diabetes and we will check an A1c. 4.  Other blood work is ordered. 5.  I will see him in 3 months time for follow-up and further recommendations will depend on blood results. 6.  Today, in addition to a preventative visit, I performed an office today to address his chronic conditions and the complexity involved in B HRT.      No orders of the defined types were placed in this encounter.    Abigail Hoffman Abigail Hoffman   05/04/2021, 9:21 AM

## 2021-05-10 LAB — CBC
HCT: 46.4 % — ABNORMAL HIGH (ref 35.0–45.0)
Hemoglobin: 15.2 g/dL (ref 11.7–15.5)
MCH: 28.2 pg (ref 27.0–33.0)
MCHC: 32.8 g/dL (ref 32.0–36.0)
MCV: 86.1 fL (ref 80.0–100.0)
MPV: 10.7 fL (ref 7.5–12.5)
Platelets: 245 10*3/uL (ref 140–400)
RBC: 5.39 10*6/uL — ABNORMAL HIGH (ref 3.80–5.10)
RDW: 12.9 % (ref 11.0–15.0)
WBC: 7.5 10*3/uL (ref 3.8–10.8)

## 2021-05-10 LAB — T3, FREE: T3, Free: 3.4 pg/mL (ref 2.3–4.2)

## 2021-05-10 LAB — COMPLETE METABOLIC PANEL WITH GFR
AG Ratio: 2.1 (calc) (ref 1.0–2.5)
ALT: 15 U/L (ref 6–29)
AST: 15 U/L (ref 10–35)
Albumin: 4.7 g/dL (ref 3.6–5.1)
Alkaline phosphatase (APISO): 67 U/L (ref 31–125)
BUN/Creatinine Ratio: 19 (calc) (ref 6–22)
BUN: 20 mg/dL (ref 7–25)
CO2: 25 mmol/L (ref 20–32)
Calcium: 9.9 mg/dL (ref 8.6–10.2)
Chloride: 103 mmol/L (ref 98–110)
Creat: 1.08 mg/dL — ABNORMAL HIGH (ref 0.50–0.99)
Globulin: 2.2 g/dL (calc) (ref 1.9–3.7)
Glucose, Bld: 107 mg/dL — ABNORMAL HIGH (ref 65–99)
Potassium: 4.2 mmol/L (ref 3.5–5.3)
Sodium: 140 mmol/L (ref 135–146)
Total Bilirubin: 0.8 mg/dL (ref 0.2–1.2)
Total Protein: 6.9 g/dL (ref 6.1–8.1)
eGFR: 64 mL/min/{1.73_m2} (ref >=60)

## 2021-05-10 LAB — TSH: TSH: 1.25 mIU/L

## 2021-05-10 LAB — T4, FREE: Free T4: 1.4 ng/dL (ref 0.8–1.8)

## 2021-05-10 LAB — HEMOGLOBIN A1C
Hgb A1c MFr Bld: 5.7 % of total Hgb — ABNORMAL HIGH (ref <5.7)
Mean Plasma Glucose: 117 mg/dL
eAG (mmol/L): 6.5 mmol/L

## 2021-05-10 LAB — TESTOS,TOTAL,FREE AND SHBG (FEMALE)
Free Testosterone: 50.5 pg/mL — ABNORMAL HIGH (ref 0.1–6.4)
Sex Hormone Binding: 88 nmol/L (ref 17–124)
Testosterone, Total, LC-MS-MS: 515 ng/dL — ABNORMAL HIGH (ref 2–45)

## 2021-05-10 LAB — LIPID PANEL
Cholesterol: 169 mg/dL (ref <200)
HDL: 74 mg/dL (ref >=50)
LDL Cholesterol (Calc): 78 mg/dL (calc)
Non-HDL Cholesterol (Calc): 95 mg/dL (calc) (ref <130)
Total CHOL/HDL Ratio: 2.3 (calc) (ref <5.0)
Triglycerides: 89 mg/dL (ref <150)

## 2021-05-10 LAB — VITAMIN D 25 HYDROXY (VIT D DEFICIENCY, FRACTURES): Vit D, 25-Hydroxy: 19 ng/mL — ABNORMAL LOW (ref 30–100)

## 2021-05-10 LAB — HEPATITIS C ANTIBODY
Hepatitis C Ab: NONREACTIVE
SIGNAL TO CUT-OFF: 0.01 (ref <1.00)

## 2021-05-10 LAB — ESTRADIOL: Estradiol: <15 pg/mL

## 2021-05-10 LAB — FOLLICLE STIMULATING HORMONE: FSH: 48.3 m[IU]/mL

## 2021-05-10 LAB — LUTEINIZING HORMONE: LH: 19.9 m[IU]/mL

## 2021-08-11 ENCOUNTER — Ambulatory Visit (INDEPENDENT_AMBULATORY_CARE_PROVIDER_SITE_OTHER): Payer: BC Managed Care – PPO | Admitting: Internal Medicine

## 2021-12-20 ENCOUNTER — Other Ambulatory Visit: Payer: Self-pay

## 2021-12-20 ENCOUNTER — Ambulatory Visit
Admission: EM | Admit: 2021-12-20 | Discharge: 2021-12-20 | Disposition: A | Discharge status: Home / Self Care | Payer: BC Managed Care – PPO | Attending: Urgent Care | Admitting: Urgent Care

## 2021-12-20 DIAGNOSIS — J069 Acute upper respiratory infection, unspecified: Secondary | ICD-10-CM

## 2021-12-20 DIAGNOSIS — R0982 Postnasal drip: Secondary | ICD-10-CM

## 2021-12-20 DIAGNOSIS — Z20822 Contact with and (suspected) exposure to covid-19: Secondary | ICD-10-CM

## 2021-12-20 MED ORDER — PROMETHAZINE-DM 6.25-15 MG/5ML PO SYRP: 5.0000 mL | ORAL_SOLUTION | Freq: Every evening | ORAL | 0 refills | Status: DC | PRN

## 2021-12-20 MED ORDER — PSEUDOEPHEDRINE HCL 60 MG PO TABS
60.0000 mg | ORAL_TABLET | Freq: Three times a day (TID) | ORAL | 0 refills | Status: AC | PRN
Start: 2021-12-20 — End: ?

## 2021-12-20 MED ORDER — BENZONATATE 100 MG PO CAPS: 100.0000 mg | ORAL_CAPSULE | Freq: Three times a day (TID) | ORAL | 0 refills | Status: AC | PRN

## 2021-12-20 MED ORDER — LEVOCETIRIZINE DIHYDROCHLORIDE 5 MG PO TABS: 5.0000 mg | ORAL_TABLET | Freq: Every evening | ORAL | 0 refills | Status: AC

## 2021-12-20 NOTE — ED Triage Notes (Signed)
Pt reports blood sugar was 150 mg/dL last night ad 132 mg/dL this morning (fasting); cough x 1 week.

## 2021-12-20 NOTE — ED Provider Notes (Signed)
[place]   MRN: [id number] DOB: [birth year]  Subjective:   Abigail Hoffman is a 46 y.o. adult presenting for 1 week history of persistent coughing, congestion.  Patient is concerned because it has affected the blood sugar.  No chest pain, shortness of breath or wheezing.  No history of asthma.  Has a history of allergic rhinitis.  Vapes daily.  He did have exposure to his dad that had COVID-19.  No current facility-administered medications for this encounter.  Current Outpatient Medications:    blood glucose meter kit and supplies KIT, Dispense based on patient and insurance preference. Use up to four times daily as directed. (FOR ICD-9 250.00, 250.01)., Disp: 1 each, Rfl: 0   metFORMIN (GLUCOPHAGE-XR) 500 MG 24 hr tablet, Take 1 tablet (500 mg total) by mouth 2 (two) times daily. (Patient not taking: Reported on 05/04/2021), Disp: 180 tablet, Rfl: 0   progesterone (PROMETRIUM) 200 MG capsule, Take 1 capsule (200 mg total) by mouth daily., Disp: 30 capsule, Rfl: 3   Testosterone Propionate (FIRST-TESTOSTERONE MC) 2 % CREA, Place 5 mg onto the skin daily., Disp: 30 g, Rfl: 0   Allergies  Allergen Reactions   Erythromycin    Penicillin G Other (See Comments)   Azithromycin Diarrhea and Other (See Comments)   Morphine Nausea And Vomiting and Other (See Comments)    Nightmares    Morphine And Related Other (See Comments)    Nightmares    Penicillins Diarrhea   Testosterone Rash    Past Medical History:  Diagnosis Date   Allergy    hay fever   Depression    old history   Sleep apnea    not on cpap     Past Surgical History:  Procedure Laterality Date   ABDOMINAL HYSTERECTOMY  2007   BRAIN TUMOR EXCISION  2002   benign - frontal   DENTAL SURGERY     TONSILLECTOMY      Family History  Problem Relation Age of Onset   Glaucoma Mother    Heart disease Maternal Grandfather        heart attack young   Heart disease Paternal Grandmother     Social History    Tobacco Use   Smoking status: Never   Smokeless tobacco: Never   Tobacco comments:    Pt states he Vaps on occasion. Keeps him from bitting nails.  Vaping Use   Vaping Use: Every day  Substance Use Topics   Alcohol use: Yes    Comment: occasionally   Drug use: No    ROS   Objective:   Vitals: BP 124/80 (BP Location: Right Arm)    Pulse 61    Temp 98.2 F (36.8 C) (Oral)    Resp 18    SpO2 96%   Physical Exam Constitutional:      General: He is not in acute distress.    Appearance: Normal appearance. He is well-developed. He is not ill-appearing, toxic-appearing or diaphoretic.  HENT:     Head: Normocephalic and atraumatic.     Right Ear: External ear normal.     Left Ear: External ear normal.     Nose: Nose normal.     Mouth/Throat:     Mouth: Mucous membranes are moist.  Eyes:     General: No scleral icterus.       Right eye: No discharge.        Left eye: No discharge.     Extraocular Movements: Extraocular movements intact.  Cardiovascular:     Rate and Rhythm: Normal rate and regular rhythm.     Heart sounds: Normal heart sounds. No murmur heard.   No friction rub. No gallop.  Pulmonary:     Effort: Pulmonary effort is normal. No respiratory distress.     Breath sounds: Normal breath sounds. No stridor. No wheezing, rhonchi or rales.  Neurological:     Mental Status: He is alert and oriented to person, place, and time.  Psychiatric:        Mood and Affect: Mood normal.        Behavior: Behavior normal.        Thought Content: Thought content normal.    Assessment and Plan :   PDMP not reviewed this encounter.  1. Viral URI with cough   2. Post-nasal drainage   3. Close exposure to COVID-19 virus    Deferred imaging given clear cardiopulmonary exam, hemodynamically stable vital signs.  Given timeline of illness, deferred respiratory testing.  Suspect viral URI, viral syndrome. Physical exam findings reassuring and vital signs stable for discharge.  Advised supportive care, offered symptomatic relief. Counseled patient on potential for adverse effects with medications prescribed/recommended today, ER and return-to-clinic precautions discussed, patient verbalized understanding.     Jaynee Eagles, [place] 12/20/21 1657

## 2022-01-12 DIAGNOSIS — E119 Type 2 diabetes mellitus without complications: Secondary | ICD-10-CM | POA: Diagnosis not present

## 2022-01-12 DIAGNOSIS — E7800 Pure hypercholesterolemia, unspecified: Secondary | ICD-10-CM | POA: Diagnosis not present

## 2022-01-12 DIAGNOSIS — Z0000 Encounter for general adult medical examination without abnormal findings: Secondary | ICD-10-CM | POA: Diagnosis not present

## 2022-01-12 DIAGNOSIS — F649 Gender identity disorder, unspecified: Secondary | ICD-10-CM | POA: Diagnosis not present

## 2022-05-11 DIAGNOSIS — F649 Gender identity disorder, unspecified: Secondary | ICD-10-CM | POA: Diagnosis not present

## 2022-05-11 DIAGNOSIS — F640 Transsexualism: Secondary | ICD-10-CM | POA: Diagnosis not present

## 2022-05-11 DIAGNOSIS — Z5181 Encounter for therapeutic drug level monitoring: Secondary | ICD-10-CM | POA: Diagnosis not present

## 2022-05-11 DIAGNOSIS — E785 Hyperlipidemia, unspecified: Secondary | ICD-10-CM | POA: Diagnosis not present

## 2022-11-11 DIAGNOSIS — F649 Gender identity disorder, unspecified: Secondary | ICD-10-CM | POA: Diagnosis not present

## 2022-11-11 DIAGNOSIS — E119 Type 2 diabetes mellitus without complications: Secondary | ICD-10-CM | POA: Diagnosis not present

## 2022-11-18 ENCOUNTER — Other Ambulatory Visit (HOSPITAL_COMMUNITY): Payer: Self-pay | Admitting: Endocrinology

## 2022-11-18 DIAGNOSIS — E119 Type 2 diabetes mellitus without complications: Secondary | ICD-10-CM

## 2022-11-18 DIAGNOSIS — F649 Gender identity disorder, unspecified: Secondary | ICD-10-CM | POA: Diagnosis not present

## 2022-11-18 DIAGNOSIS — E7800 Pure hypercholesterolemia, unspecified: Secondary | ICD-10-CM | POA: Diagnosis not present

## 2022-11-18 DIAGNOSIS — Z5181 Encounter for therapeutic drug level monitoring: Secondary | ICD-10-CM | POA: Diagnosis not present

## 2023-01-06 DIAGNOSIS — E119 Type 2 diabetes mellitus without complications: Secondary | ICD-10-CM | POA: Diagnosis not present

## 2023-01-06 DIAGNOSIS — Z0000 Encounter for general adult medical examination without abnormal findings: Secondary | ICD-10-CM | POA: Diagnosis not present

## 2023-01-06 DIAGNOSIS — E7800 Pure hypercholesterolemia, unspecified: Secondary | ICD-10-CM | POA: Diagnosis not present

## 2023-01-11 ENCOUNTER — Ambulatory Visit (HOSPITAL_COMMUNITY)
Admission: RE | Admit: 2023-01-11 | Discharge: 2023-01-11 | Disposition: A | Discharge status: Home / Self Care | Payer: BC Managed Care – PPO | Source: Ambulatory Visit | Attending: Endocrinology | Admitting: Endocrinology

## 2023-01-11 DIAGNOSIS — E119 Type 2 diabetes mellitus without complications: Secondary | ICD-10-CM | POA: Insufficient documentation

## 2023-01-17 DIAGNOSIS — Z0000 Encounter for general adult medical examination without abnormal findings: Secondary | ICD-10-CM | POA: Diagnosis not present

## 2023-01-17 DIAGNOSIS — F640 Transsexualism: Secondary | ICD-10-CM | POA: Diagnosis not present

## 2023-01-17 DIAGNOSIS — M79674 Pain in right toe(s): Secondary | ICD-10-CM | POA: Diagnosis not present

## 2023-01-17 DIAGNOSIS — E7800 Pure hypercholesterolemia, unspecified: Secondary | ICD-10-CM | POA: Diagnosis not present

## 2023-01-17 DIAGNOSIS — L600 Ingrowing nail: Secondary | ICD-10-CM | POA: Diagnosis not present

## 2023-01-17 DIAGNOSIS — E119 Type 2 diabetes mellitus without complications: Secondary | ICD-10-CM | POA: Diagnosis not present

## 2023-02-01 DIAGNOSIS — Z1211 Encounter for screening for malignant neoplasm of colon: Secondary | ICD-10-CM | POA: Diagnosis not present

## 2023-03-21 DIAGNOSIS — K635 Polyp of colon: Secondary | ICD-10-CM | POA: Diagnosis not present

## 2023-03-21 DIAGNOSIS — Z1211 Encounter for screening for malignant neoplasm of colon: Secondary | ICD-10-CM | POA: Diagnosis not present

## 2023-05-23 DIAGNOSIS — Z5181 Encounter for therapeutic drug level monitoring: Secondary | ICD-10-CM | POA: Diagnosis not present

## 2023-05-23 DIAGNOSIS — E7800 Pure hypercholesterolemia, unspecified: Secondary | ICD-10-CM | POA: Diagnosis not present

## 2023-05-23 DIAGNOSIS — F649 Gender identity disorder, unspecified: Secondary | ICD-10-CM | POA: Diagnosis not present

## 2023-05-23 DIAGNOSIS — E119 Type 2 diabetes mellitus without complications: Secondary | ICD-10-CM | POA: Diagnosis not present

## 2023-06-01 DIAGNOSIS — F649 Gender identity disorder, unspecified: Secondary | ICD-10-CM | POA: Diagnosis not present

## 2023-06-01 DIAGNOSIS — E7800 Pure hypercholesterolemia, unspecified: Secondary | ICD-10-CM | POA: Diagnosis not present

## 2023-06-01 DIAGNOSIS — E119 Type 2 diabetes mellitus without complications: Secondary | ICD-10-CM | POA: Diagnosis not present

## 2023-06-01 DIAGNOSIS — Z5181 Encounter for therapeutic drug level monitoring: Secondary | ICD-10-CM | POA: Diagnosis not present

## 2023-10-06 DIAGNOSIS — E119 Type 2 diabetes mellitus without complications: Secondary | ICD-10-CM | POA: Diagnosis not present

## 2023-10-06 DIAGNOSIS — E7800 Pure hypercholesterolemia, unspecified: Secondary | ICD-10-CM | POA: Diagnosis not present

## 2023-10-13 DIAGNOSIS — E119 Type 2 diabetes mellitus without complications: Secondary | ICD-10-CM | POA: Diagnosis not present

## 2023-10-13 DIAGNOSIS — F649 Gender identity disorder, unspecified: Secondary | ICD-10-CM | POA: Diagnosis not present

## 2023-10-13 DIAGNOSIS — E7800 Pure hypercholesterolemia, unspecified: Secondary | ICD-10-CM | POA: Diagnosis not present

## 2023-10-13 DIAGNOSIS — Z5181 Encounter for therapeutic drug level monitoring: Secondary | ICD-10-CM | POA: Diagnosis not present

## 2023-12-10 ENCOUNTER — Ambulatory Visit
Admission: EM | Admit: 2023-12-10 | Discharge: 2023-12-10 | Disposition: A | Discharge status: Home / Self Care | Payer: BC Managed Care – PPO

## 2023-12-10 DIAGNOSIS — R110 Nausea: Secondary | ICD-10-CM | POA: Diagnosis not present

## 2023-12-10 DIAGNOSIS — J069 Acute upper respiratory infection, unspecified: Secondary | ICD-10-CM | POA: Diagnosis not present

## 2023-12-10 LAB — POC COVID19/FLU A&B COMBO
Covid Antigen, POC: NEGATIVE
Influenza A Antigen, POC: NEGATIVE
Influenza B Antigen, POC: NEGATIVE

## 2023-12-10 MED ORDER — PROMETHAZINE-DM 6.25-15 MG/5ML PO SYRP
5.0000 mL | ORAL_SOLUTION | Freq: Four times a day (QID) | ORAL | 0 refills | Status: AC | PRN
Start: 2023-12-10 — End: ?

## 2023-12-10 MED ORDER — FLUTICASONE PROPIONATE 50 MCG/ACT NA SUSP: 2.0000 | Freq: Every day | NASAL | 0 refills | Status: AC

## 2023-12-10 MED ORDER — ONDANSETRON 4 MG PO TBDP
4.0000 mg | ORAL_TABLET | Freq: Three times a day (TID) | ORAL | 0 refills | Status: AC | PRN
Start: 2023-12-10 — End: ?

## 2023-12-10 NOTE — Discharge Instructions (Addendum)
 The COVID/flu test was negative. Take medication as prescribed. Increase fluids and allow for plenty of rest.  Recommend Pedialyte or Gatorade to help prevent dehydration. May take over-the-counter Tylenol or ibuprofen as needed for pain, fever, or general discomfort. May use normal saline nasal spray throughout the day for nasal congestion and runny nose. Recommend a brat diet to include bananas, rice, applesauce, and toast while nausea persist. For the cough, recommend using a humidifier in your bedroom at nighttime during sleep and sleeping elevated on pillows while cough symptoms persist. Symptoms should improve over the next 7 to 10 days.  If symptoms fail to improve, or appear to be worsening, you may follow-up in this clinic or with your primary care physician for further evaluation. Follow-up as needed.

## 2023-12-10 NOTE — ED Provider Notes (Signed)
 [facility]    CSN: [id number] Arrival date & time: 12/10/23  1208      History   Chief Complaint Chief Complaint  Patient presents with   Cough   Emesis    HPI Abigail Hoffman is a 48 y.o. adult.   The history is provided by the patient.   Patient presents with a 4-day history of nausea, vomiting, decreased appetite, nasal congestion, body aches, and chills.  States cough is been present for 2 weeks.  Denies headache, ear pain, wheezing, difficulty breathing, chest pain, abdominal pain, diarrhea, or rash.  Has been taking over-the-counter decongestants for symptoms.  Reports a recent cruise prior to symptoms starting.  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      Home Medications    Prior to Admission medications   Medication Sig Start Date End Date Taking? Authorizing Provider  fluticasone (FLONASE) 50 MCG/ACT nasal spray Place 2 sprays into both nostrils daily. 12/10/23  Yes Leath-Warren, Sadie Haber, NP  ondansetron (ZOFRAN-ODT) 4 MG disintegrating tablet Take 1 tablet (4 mg total) by mouth every 8 (eight) hours as needed. 12/10/23  Yes Leath-Warren, Sadie Haber, NP  promethazine-dextromethorphan (PROMETHAZINE-DM) 6.25-15 MG/5ML syrup Take 5 mLs by mouth 4 (four) times daily as needed. 12/10/23  Yes Leath-Warren, Sadie Haber, NP  rosuvastatin (CRESTOR) 5 MG tablet Take 5 mg by mouth daily. 09/03/23  Yes [provider]  Sod Picosulfate-Mag Ox-Cit Acd (CLENPIQ) 10-3.5-12 MG-GM  -GM/175ML SOLN 175 mL orally twice for 2 03/08/23  Yes [provider]  Testosterone Propionate (FIRST-TESTOSTERONE MC) 2 % CREA Place 5 mg onto the skin daily. 04/15/20  Yes Gosrani, Nimish C, MD  benzonatate (TESSALON) 100 MG capsule Take 1-2 capsules (100-200 mg total) by mouth 3 (three) times daily as needed for cough. 12/20/21   Wallis Bamberg, PA-C  blood glucose meter kit and supplies KIT Dispense based on patient and insurance preference. Use up to four times daily as directed. (FOR ICD-9 250.00, 250.01). 01/13/20   Lilly Cove C, MD  levocetirizine (XYZAL) 5 MG tablet Take 1 tablet (5 mg total) by mouth every evening. 12/20/21   Wallis Bamberg, PA-C  metFORMIN (GLUCOPHAGE-XR) 500 MG 24 hr tablet Take 1 tablet (500 mg total) by mouth 2 (two) times daily. Patient not taking: Reported on 05/04/2021 04/15/20   Wilson Singer, MD  progesterone (PROMETRIUM) 200 MG capsule Take 1 capsule (200 mg total) by mouth daily. 12/14/20   Wilson Singer, MD  pseudoephedrine (SUDAFED) 60 MG tablet Take 1 tablet (60 mg total) by mouth every 8 (eight) hours as needed for congestion. 12/20/21   Wallis Bamberg, PA-C    Family History Family History  Problem Relation Age of Onset   Glaucoma Mother    Heart disease Maternal Grandfather        heart attack young   Heart  disease Paternal Grandmother     Social History Social History   Tobacco Use   Smoking status: Never   Smokeless tobacco: Never   Tobacco comments:    Pt states he Vaps on occasion. Keeps him from bitting nails.  Vaping Use   Vaping status: Every Day  Substance Use Topics   Alcohol use: Yes    Comment: occasionally   Drug use: No     Allergies   Erythromycin, Penicillin g, Azithromycin, Morphine, Morphine and codeine, Penicillins, and Testosterone   Review of Systems Review of Systems Per HPI  Physical Exam Triage Vital Signs ED Triage Vitals  Encounter Vitals Group     BP 12/10/23 1230 (!) 151/85     Systolic BP  Percentile --      Diastolic BP Percentile --      Pulse Rate 12/10/23 1230 68     Resp 12/10/23 1230 16     Temp 12/10/23 1230 98.7 F (37.1 C)     Temp Source 12/10/23 1230 Oral     SpO2 12/10/23 1230 97 %     Weight --      Height --      Head Circumference --      Peak Flow --      Pain Score 12/10/23 1229 0     Pain Loc --      Pain Education --      Exclude from Growth Chart --    No data found.  Updated Vital Signs BP (!) 151/85 (BP Location: Right Arm)   Pulse 68   Temp 98.7 F (37.1 C) (Oral)   Resp 16   SpO2 97%   Visual Acuity Right Eye Distance:   Left Eye Distance:   Bilateral Distance:    Right Eye Near:   Left Eye Near:    Bilateral Near:     Physical Exam Vitals and nursing note reviewed.  Constitutional:      General: He is not in acute distress.    Appearance: Normal appearance.  HENT:     Head: Normocephalic.     Right Ear: Tympanic membrane, ear canal and external ear normal.     Left Ear: Tympanic membrane, ear canal and external ear normal.     Nose: Nose normal.     Right Turbinates: Enlarged and swollen.     Left Turbinates: Enlarged and swollen.     Right Sinus: No maxillary sinus tenderness or frontal sinus tenderness.     Left Sinus: No maxillary sinus tenderness or frontal sinus tenderness.     Mouth/Throat:     Lips: Pink.     Mouth: Mucous membranes are moist.     Pharynx: Oropharynx is clear. Uvula midline. Postnasal drip present. No pharyngeal swelling, oropharyngeal exudate or posterior oropharyngeal erythema.  Eyes:     Extraocular Movements: Extraocular movements intact.     Conjunctiva/sclera: Conjunctivae normal.     Pupils: Pupils are equal, round, and reactive to light.  Cardiovascular:     Rate and Rhythm: Normal rate and regular rhythm.     Pulses: Normal pulses.     Heart sounds: Normal heart sounds.  Pulmonary:     Effort: Pulmonary effort is normal. No respiratory distress.     Breath sounds: Normal breath  sounds. No stridor. No wheezing, rhonchi or rales.  Chest:     Chest wall: No tenderness.  Abdominal:     General: Bowel sounds are normal.     Palpations: Abdomen  is soft.     Tenderness: There is no abdominal tenderness.  Musculoskeletal:     Cervical back: Normal range of motion.  Lymphadenopathy:     Cervical: No cervical adenopathy.  Skin:    General: Skin is warm and dry.  Neurological:     General: No focal deficit present.     Mental Status: He is alert and oriented to person, place, and time.  Psychiatric:        Mood and Affect: Mood normal.        Behavior: Behavior normal.      UC Treatments / Results  Labs (all labs ordered are listed, but only abnormal results are displayed) Labs Reviewed  POC COVID19/FLU A&B COMBO - Normal    EKG   Radiology No results found.  Procedures Procedures (including critical care time)  Medications Ordered in UC Medications - No data to display  Initial Impression / Assessment and Plan / UC Course  I have reviewed the triage vital signs and the nursing notes.  Pertinent labs & imaging results that were available during my care of the patient were reviewed by me and considered in my medical decision making (see chart for details).  COVID/flu test was negative.  Symptoms consistent with a viral URI with cough.  Will provide symptomatic treatment with Promethazine DM for the cough, fluticasone 50 micro nasal spray, and Zofran 4 mg ODT for nausea and vomiting.  Supportive care recommendations were provided and discussed with the patient to include fluids, rest, over-the-counter analgesics, and a brat diet until nausea improves.  Discussed indications regarding follow-up.  Patient was in agreement with this plan of care and verbalizes understanding.  All questions were answered.  Patient stable for discharge.  Work note was provided.  Final Clinical Impressions(s) / UC Diagnoses   Final diagnoses:  Viral URI with cough  Nausea      Discharge Instructions      The COVID/flu test was negative. Take medication as prescribed. Increase fluids and allow for plenty of rest.  Recommend Pedialyte or Gatorade to help prevent dehydration. May take over-the-counter Tylenol or ibuprofen as needed for pain, fever, or general discomfort. May use normal saline nasal spray throughout the day for nasal congestion and runny nose. Recommend a brat diet to include bananas, rice, applesauce, and toast while nausea persist. For the cough, recommend using a humidifier in your bedroom at nighttime during sleep and sleeping elevated on pillows while cough symptoms persist. Symptoms should improve over the next 7 to 10 days.  If symptoms fail to improve, or appear to be worsening, you may follow-up in this clinic or with your primary care physician for further evaluation. Follow-up as needed.     ED Prescriptions     Medication Sig Dispense Auth. Provider   promethazine-dextromethorphan (PROMETHAZINE-DM) 6.25-15 MG/5ML syrup Take 5 mLs by mouth 4 (four) times daily as needed. 118 mL Leath-Warren, Sadie Haber, NP   ondansetron (ZOFRAN-ODT) 4 MG disintegrating tablet Take 1 tablet (4 mg total) by mouth every 8 (eight) hours as needed. 20 tablet Leath-Warren, Sadie Haber, NP   fluticasone (FLONASE) 50 MCG/ACT nasal spray Place 2 sprays into both nostrils daily. 16 g Leath-Warren, Sadie Haber, NP      PDMP not reviewed this encounter.   Abran Cantor, NP 12/10/23 1302

## 2023-12-10 NOTE — ED Triage Notes (Addendum)
 Cough x 2 weeks  Patient was on cruise [place] week.  Nausea Vomiting Loss of appetite

## 2024-01-09 DIAGNOSIS — E119 Type 2 diabetes mellitus without complications: Secondary | ICD-10-CM | POA: Diagnosis not present

## 2024-01-09 DIAGNOSIS — F649 Gender identity disorder, unspecified: Secondary | ICD-10-CM | POA: Diagnosis not present

## 2024-01-09 DIAGNOSIS — E7800 Pure hypercholesterolemia, unspecified: Secondary | ICD-10-CM | POA: Diagnosis not present

## 2024-01-09 DIAGNOSIS — Z0000 Encounter for general adult medical examination without abnormal findings: Secondary | ICD-10-CM | POA: Diagnosis not present

## 2024-01-19 DIAGNOSIS — Z0000 Encounter for general adult medical examination without abnormal findings: Secondary | ICD-10-CM | POA: Diagnosis not present

## 2024-02-19 DIAGNOSIS — E7800 Pure hypercholesterolemia, unspecified: Secondary | ICD-10-CM | POA: Diagnosis not present

## 2024-02-19 DIAGNOSIS — E119 Type 2 diabetes mellitus without complications: Secondary | ICD-10-CM | POA: Diagnosis not present

## 2024-02-19 DIAGNOSIS — Z5181 Encounter for therapeutic drug level monitoring: Secondary | ICD-10-CM | POA: Diagnosis not present

## 2024-02-19 DIAGNOSIS — F649 Gender identity disorder, unspecified: Secondary | ICD-10-CM | POA: Diagnosis not present

## 2024-06-25 DIAGNOSIS — F649 Gender identity disorder, unspecified: Secondary | ICD-10-CM | POA: Diagnosis not present

## 2024-06-25 DIAGNOSIS — Z0000 Encounter for general adult medical examination without abnormal findings: Secondary | ICD-10-CM | POA: Diagnosis not present

## 2024-06-25 DIAGNOSIS — E7800 Pure hypercholesterolemia, unspecified: Secondary | ICD-10-CM | POA: Diagnosis not present

## 2024-06-25 DIAGNOSIS — E119 Type 2 diabetes mellitus without complications: Secondary | ICD-10-CM | POA: Diagnosis not present

## 2024-07-02 DIAGNOSIS — Z5181 Encounter for therapeutic drug level monitoring: Secondary | ICD-10-CM | POA: Diagnosis not present

## 2024-07-02 DIAGNOSIS — F649 Gender identity disorder, unspecified: Secondary | ICD-10-CM | POA: Diagnosis not present

## 2024-07-02 DIAGNOSIS — E119 Type 2 diabetes mellitus without complications: Secondary | ICD-10-CM | POA: Diagnosis not present

## 2024-07-02 DIAGNOSIS — E7800 Pure hypercholesterolemia, unspecified: Secondary | ICD-10-CM | POA: Diagnosis not present

## 2024-09-09 DIAGNOSIS — Z1231 Encounter for screening mammogram for malignant neoplasm of breast: Secondary | ICD-10-CM | POA: Diagnosis not present

## 2024-09-09 DIAGNOSIS — F649 Gender identity disorder, unspecified: Secondary | ICD-10-CM | POA: Diagnosis not present

## 2024-09-09 DIAGNOSIS — F640 Transsexualism: Secondary | ICD-10-CM | POA: Diagnosis not present
